# Patient Record
Sex: Male | Born: 1985 | Race: White | Hispanic: No | Marital: Single | Smoking: Never smoker
Health system: Southern US, Community
[De-identification: ages and names within clinical notes are randomized; demographics above are authoritative.]

## PROBLEM LIST (undated history)

## (undated) DIAGNOSIS — R569 Unspecified convulsions: Secondary | ICD-10-CM

---
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
## 2007-09-28 HISTORY — PX: NEUROMA SURGERY: SHX722

## 2012-12-08 ENCOUNTER — Encounter (HOSPITAL_COMMUNITY): Payer: Self-pay | Admitting: Cardiology

## 2012-12-08 ENCOUNTER — Emergency Department (HOSPITAL_COMMUNITY)
Admission: EM | Admit: 2012-12-08 | Discharge: 2012-12-08 | Disposition: A | Discharge status: Home / Self Care | Payer: Medicare Other | Attending: Emergency Medicine | Admitting: Emergency Medicine

## 2012-12-08 DIAGNOSIS — G40909 Epilepsy, unspecified, not intractable, without status epilepticus: Secondary | ICD-10-CM | POA: Insufficient documentation

## 2012-12-08 DIAGNOSIS — H9190 Unspecified hearing loss, unspecified ear: Secondary | ICD-10-CM | POA: Insufficient documentation

## 2012-12-08 DIAGNOSIS — H9192 Unspecified hearing loss, left ear: Secondary | ICD-10-CM

## 2012-12-08 DIAGNOSIS — Z79899 Other long term (current) drug therapy: Secondary | ICD-10-CM | POA: Insufficient documentation

## 2012-12-08 HISTORY — DX: Unspecified convulsions: R56.9

## 2012-12-08 NOTE — ED Notes (Signed)
Pt reports pain in his left ear. States he took a shower and got water in it and now has not been able to hear anything. Denies any pain. Reports he saw some blood on a q-tip today.

## 2012-12-08 NOTE — ED Provider Notes (Signed)
History    This chart was scribed for Zachary Morn NP, a non-physician practitioner working with Doug Sou, MD  found by Lewanda Rife, ED Scribe. This patient was seen in room TR11C and the patient's care was started at 1700.     CSN: [id number]  Arrival date & time 12/08/12  1529   None     Chief Complaint  Patient presents with  . Otalgia    (Consider location/radiation/quality/duration/timing/severity/associated sxs/prior treatment) The history is provided by the patient.   [name] is a 27 y.o. male who presents to the Emergency Department complaining of mild constant "muffled" hearing in left ear onset today. Zachary Patrick denies recent injury, recent illness, fever, tinnitus, recurrent ear infections, and pain. Zachary Patrick denies trying to irrigate ear to treat symptoms.    Past Medical History  Diagnosis Date  . Seizures     History reviewed. No pertinent past surgical history.  History reviewed. No pertinent family history.  History  Substance Use Topics  . Smoking status: Never Smoker   . Smokeless tobacco: Not on file  . Alcohol Use: Yes      Review of Systems  Constitutional: Negative.  Negative for fever.  HENT: Positive for hearing loss. Negative for ear pain, rhinorrhea, tinnitus and ear discharge.   Respiratory: Negative.   Cardiovascular: Negative.   Gastrointestinal: Negative.   Musculoskeletal: Negative.   Skin: Negative.   Neurological: Negative.   Psychiatric/Behavioral: Negative.   All other systems reviewed and are negative.   A complete 10 system review of systems was obtained and all systems are negative except as noted in the HPI and PMH.    Allergies  Review of patient's allergies indicates no known allergies.  Home Medications   Current Outpatient Rx  Name  Route  Sig  Dispense  Refill  . lacosamide (VIMPAT) 200 MG TABS   Oral   Take 200 mg by mouth 2 (two) times daily.           BP 162/94  Pulse 70  Temp(Src) 98.3 F  (36.8 C) (Oral)  SpO2 97%  Physical Exam  Nursing note and vitals reviewed. Constitutional: He is oriented to person, place, and time. He appears well-developed and well-nourished. No distress.  HENT:  Head: Normocephalic and atraumatic.  Right Ear: Hearing, tympanic membrane, external ear and ear canal normal.  Left Ear: Tympanic membrane, external ear and ear canal normal. No drainage, swelling or tenderness. Tympanic membrane is not erythematous.  No middle ear effusion.  Zachary Patrick states hearing is "muffled" when discerning rubbing his own hair by left ear.  Eyes: EOM are normal.  Neck: Normal range of motion. Neck supple. No tracheal deviation present.  Cardiovascular: Normal rate.   Pulmonary/Chest: Effort normal. No respiratory distress.  Abdominal: Soft.  Musculoskeletal: Normal range of motion.  Neurological: He is alert and oriented to person, place, and time.  Skin: Skin is warm and dry.  Psychiatric: He has a normal mood and affect. His behavior is normal.    ED Course  Procedures (including critical care time) Medications - No data to display  Labs Reviewed - No data to display No results found.   1. Hearing decreased, left       MDM  I personally performed the services described in this documentation, which was scribed in my presence. The recorded information has been reviewed and is accurate.         Jimmye Norman, NP 12/08/12 2017

## 2012-12-09 NOTE — ED Provider Notes (Signed)
Medical screening examination/treatment/procedure(s) were performed by non-physician practitioner and as supervising physician I was immediately available for consultation/collaboration.  Sam Jacubowitz, MD 12/09/12 0009 

## 2014-03-25 ENCOUNTER — Emergency Department (HOSPITAL_COMMUNITY)
Admission: EM | Admit: 2014-03-25 | Discharge: 2014-03-25 | Disposition: A | Discharge status: Home / Self Care | Payer: Medicare Other | Attending: Emergency Medicine | Admitting: Emergency Medicine

## 2014-03-25 ENCOUNTER — Encounter (HOSPITAL_COMMUNITY): Payer: Self-pay | Admitting: Emergency Medicine

## 2014-03-25 ENCOUNTER — Emergency Department (HOSPITAL_COMMUNITY): Payer: Medicare Other

## 2014-03-25 DIAGNOSIS — Y929 Unspecified place or not applicable: Secondary | ICD-10-CM | POA: Insufficient documentation

## 2014-03-25 DIAGNOSIS — Y939 Activity, unspecified: Secondary | ICD-10-CM | POA: Insufficient documentation

## 2014-03-25 DIAGNOSIS — R296 Repeated falls: Secondary | ICD-10-CM | POA: Insufficient documentation

## 2014-03-25 DIAGNOSIS — S63509A Unspecified sprain of unspecified wrist, initial encounter: Secondary | ICD-10-CM | POA: Insufficient documentation

## 2014-03-25 DIAGNOSIS — Z79899 Other long term (current) drug therapy: Secondary | ICD-10-CM | POA: Diagnosis not present

## 2014-03-25 DIAGNOSIS — G40909 Epilepsy, unspecified, not intractable, without status epilepticus: Secondary | ICD-10-CM | POA: Insufficient documentation

## 2014-03-25 DIAGNOSIS — S59909A Unspecified injury of unspecified elbow, initial encounter: Secondary | ICD-10-CM | POA: Diagnosis present

## 2014-03-25 DIAGNOSIS — S63502A Unspecified sprain of left wrist, initial encounter: Secondary | ICD-10-CM

## 2014-03-25 NOTE — ED Notes (Signed)
Ortho at bedside.

## 2014-03-25 NOTE — ED Notes (Signed)
PT ambulated with baseline gait; VSS; A&Ox3; no signs of distress; respirations even and unlabored; skin warm and dry; no questions upon discharge.  

## 2014-03-25 NOTE — ED Notes (Signed)
Pt cannot tell me what epilepsy medicine is; needs to take by 2000.

## 2014-03-25 NOTE — Progress Notes (Signed)
Orthopedic Tech Progress Note Patient Details:  Zachary Patrick 23-Jan-1986 [id number]  Ortho Devices Type of Ortho Device: Velcro wrist splint Ortho Device/Splint Location: lue Ortho Device/Splint Interventions: Application   Crawford, Rembert 03/25/2014, 8:32 PM

## 2014-03-25 NOTE — Discharge Instructions (Signed)
1. Medications: usual home medications 2. Treatment: rest, drink plenty of fluids, ice, elevate, wear your brace 3. Follow Up: Please followup with your hand orthopedics and your pcp for discussion of your diagnoses and further evaluation after today's visit; if you do not have a primary care doctor use the resource guide provided to find one;     Wrist Sprain with Rehab A sprain is an injury in which a ligament that maintains the proper alignment of a joint is partially or completely torn. The ligaments of the wrist are susceptible to sprains. Sprains are classified into three categories. Grade 1 sprains cause pain, but the tendon is not lengthened. Grade 2 sprains include a lengthened ligament because the ligament is stretched or partially ruptured. With grade 2 sprains there is still function, although the function may be diminished. Grade 3 sprains are characterized by a complete tear of the tendon or muscle, and function is usually impaired. SYMPTOMS   Pain tenderness, inflammation, and/or bruising (contusion) of the injury.  A "pop" or tear felt and/or heard at the time of injury.  Decreased wrist function. CAUSES  A wrist sprain occurs when a force is placed on one or more ligaments that is greater than it/they can withstand. Common mechanisms of injury include:  Catching a ball with you hands.  Repetitive and/ or strenuous extension or flexion of the wrist. RISK INCREASES WITH:  Previous wrist injury.  Contact sports (boxing or wrestling).  Activities in which falling is common.  Poor strength and flexibility.  Improperly fitted or padded protective equipment. PREVENTION  Warm up and stretch properly before activity.  Allow for adequate recovery between workouts.  Maintain physical fitness:  Strength, flexibility, and endurance.  Cardiovascular fitness.  Protect the wrist joint by limiting its motion with the use of taping, braces, or splints.  Protect the wrist  after injury for 6 to 12 months. PROGNOSIS  The prognosis for wrist sprains depends on the degree of injury. Grade 1 sprains require 2 to 6 weeks of treatment. Grade 2 sprains require 6 to 8 weeks of treatment, and grade 3 sprains require up to 12 weeks.  RELATED COMPLICATIONS   Prolonged healing time, if improperly treated or re-injured.  Recurrent symptoms that result in a chronic problem.  Injury to nearby structures (bone, cartilage, nerves, or tendons).  Arthritis of the wrist.  Inability to compete in athletics at a high level.  Wrist stiffness or weakness.  Progression to a complete rupture of the ligament. TREATMENT  Treatment initially involves resting from any activities that aggravate the symptoms, and the use of ice and medications to help reduce pain and inflammation. Your caregiver may recommend immobilizing the wrist for a period of time in order to reduce stress on the ligament and allow for healing. After immobilization it is important to perform strengthening and stretching exercises to help regain strength and a full range of motion. These exercises may be completed at home or with a therapist. Surgery is not usually required for wrist sprains, unless the ligament has been ruptured (grade 3 sprain). MEDICATION   If pain medication is necessary, then nonsteroidal anti-inflammatory medications, such as aspirin and ibuprofen, or other minor pain relievers, such as acetaminophen, are often recommended.  Do not take pain medication for 7 days before surgery.  Prescription pain relievers may be given if deemed necessary by your caregiver. Use only as directed and only as much as you need. HEAT AND COLD  Cold treatment (icing) relieves pain and  reduces inflammation. Cold treatment should be applied for 10 to 15 minutes every 2 to 3 hours for inflammation and pain and immediately after any activity that aggravates your symptoms. Use ice packs or massage the area with a piece  of ice (ice massage).  Heat treatment may be used prior to performing the stretching and strengthening activities prescribed by your caregiver, physical therapist, or athletic trainer. Use a heat pack or soak your injury in warm water. SEEK MEDICAL CARE IF:  Treatment seems to offer no benefit, or the condition worsens.  Any medications produce adverse side effects. EXERCISES RANGE OF MOTION (ROM) AND STRETCHING EXERCISES - Wrist Sprain  These exercises may help you when beginning to rehabilitate your injury. Your symptoms may resolve with or without further involvement from your physician, physical therapist or athletic trainer. While completing these exercises, remember:   Restoring tissue flexibility helps normal motion to return to the joints. This allows healthier, less painful movement and activity.  An effective stretch should be held for at least 30 seconds.  A stretch should never be painful. You should only feel a gentle lengthening or release in the stretched tissue. RANGE OF MOTION - Wrist Flexion, Active-Assisted  Extend your right / left elbow with your fingers pointing down.*  Gently pull the back of your hand towards you until you feel a gentle stretch on the top of your forearm.  Hold this position for __________ seconds. Repeat __________ times. Complete this exercise __________ times per day.  *If directed by your physician, physical therapist or athletic trainer, complete this stretch with your elbow bent rather than extended. RANGE OF MOTION - Wrist Extension, Active-Assisted  Extend your right / left elbow and turn your palm upwards.*  Gently pull your palm/fingertips back so your wrist extends and your fingers point more toward the ground.  You should feel a gentle stretch on the inside of your forearm.  Hold this position for __________ seconds. Repeat __________ times. Complete this exercise __________ times per day. *If directed by your physician,  physical therapist or athletic trainer, complete this stretch with your elbow bent, rather than extended. RANGE OF MOTION - Supination, Active  Stand or sit with your elbows at your side. Bend your right / left elbow to 90 degrees.  Turn your palm upward until you feel a gentle stretch on the inside of your forearm.  Hold this position for __________ seconds. Slowly release and return to the starting position. Repeat __________ times. Complete this stretch __________ times per day.  RANGE OF MOTION - Pronation, Active  Stand or sit with your elbows at your side. Bend your right / left elbow to 90 degrees.  Turn your palm downward until you feel a gentle stretch on the top of your forearm.  Hold this position for __________ seconds. Slowly release and return to the starting position. Repeat __________ times. Complete this stretch __________ times per day.  STRETCH - Wrist Flexion  Place the back of your right / left hand on a tabletop leaving your elbow slightly bent. Your fingers should point away from your body.  Gently press the back of your hand down onto the table by straightening your elbow. You should feel a stretch on the top of your forearm.  Hold this position for __________ seconds. Repeat __________ times. Complete this stretch __________ times per day.  STRETCH - Wrist Extension  Place your right / left fingertips on a tabletop leaving your elbow slightly bent. Your fingers should point  backwards.  Gently press your fingers and palm down onto the table by straightening your elbow. You should feel a stretch on the inside of your forearm.  Hold this position for __________ seconds. Repeat __________ times. Complete this stretch __________ times per day.  STRENGTHENING EXERCISES - Wrist Sprain These exercises may help you when beginning to rehabilitate your injury. They may resolve your symptoms with or without further involvement from your physician, physical therapist or  athletic trainer. While completing these exercises, remember:   Muscles can gain both the endurance and the strength needed for everyday activities through controlled exercises.  Complete these exercises as instructed by your physician, physical therapist or athletic trainer. Progress with the resistance and repetition exercises only as your caregiver advises. STRENGTH - Wrist Flexors  Sit with your right / left forearm palm-up and fully supported. Your elbow should be resting below the height of your shoulder. Allow your wrist to extend over the edge of the surface.  Loosely holding a __________ weight or a piece of rubber exercise band/tubing, slowly curl your hand up toward your forearm.  Hold this position for __________ seconds. Slowly lower the wrist back to the starting position in a controlled manner. Repeat __________ times. Complete this exercise __________ times per day.  STRENGTH - Wrist Extensors  Sit with your right / left forearm palm-down and fully supported. Your elbow should be resting below the height of your shoulder. Allow your wrist to extend over the edge of the surface.  Loosely holding a __________ weight or a piece of rubber exercise band/tubing, slowly curl your hand up toward your forearm.  Hold this position for __________ seconds. Slowly lower the wrist back to the starting position in a controlled manner. Repeat __________ times. Complete this exercise __________ times per day.  STRENGTH - Ulnar Deviators  Stand with a ____________________ weight in your right / left hand, or sit holding on to the rubber exercise band/tubing with your opposite arm supported.  Move your wrist so that your pinkie travels toward your forearm and your thumb moves away from your forearm.  Hold this position for __________ seconds and then slowly lower the wrist back to the starting position. Repeat __________ times. Complete this exercise __________ times per day STRENGTH -  Radial Deviators  Stand with a ____________________ weight in your  right / left hand, or sit holding on to the rubber exercise band/tubing with your arm supported.  Raise your hand upward in front of you or pull up on the rubber tubing.  Hold this position for __________ seconds and then slowly lower the wrist back to the starting position. Repeat __________ times. Complete this exercise __________ times per day. STRENGTH - Forearm Supinators  Sit with your right / left forearm supported on a table, keeping your elbow below shoulder height. Rest your hand over the edge, palm down.  Gently grip a hammer or a soup ladle.  Without moving your elbow, slowly turn your palm and hand upward to a "thumbs-up" position.  Hold this position for __________ seconds. Slowly return to the starting position. Repeat __________ times. Complete this exercise __________ times per day.  STRENGTH - Forearm Pronators  Sit with your right / left forearm supported on a table, keeping your elbow below shoulder height. Rest your hand over the edge, palm up.  Gently grip a hammer or a soup ladle.  Without moving your elbow, slowly turn your palm and hand upward to a "thumbs-up" position.  Hold this position  for __________ seconds. Slowly return to the starting position. Repeat __________ times. Complete this exercise __________ times per day.  STRENGTH - Grip  Grasp a tennis ball, a dense sponge, or a large, rolled sock in your hand.  Squeeze as hard as you can without increasing any pain.  Hold this position for __________ seconds. Release your grip slowly. Repeat __________ times. Complete this exercise __________ times per day.  Document Released: 09/13/2005 Document Revised: 12/06/2011 Document Reviewed: 12/26/2008 [facility] Patient Information 2015 [place], [place]. This information is not intended to replace advice given to you by your health care provider. Make sure you discuss any questions you  have with your health care provider.

## 2014-03-25 NOTE — ED Notes (Signed)
Jarrett Soho, PA-C, is at the bedside.

## 2014-03-25 NOTE — ED Notes (Signed)
PA at bedside.

## 2014-03-25 NOTE — ED Notes (Signed)
Reports having epilepsy and had a fall this weekend, having pain to left wrist since. +radial pulse, able to move digits, no obv injury noted.

## 2014-03-25 NOTE — ED Provider Notes (Signed)
Medical screening examination/treatment/procedure(s) were performed by non-physician practitioner and as supervising physician I was immediately available for consultation/collaboration.   EKG Interpretation None        [place], DO 03/25/14 2319

## 2014-03-25 NOTE — ED Provider Notes (Signed)
CSN: [id number]     Arrival date & time 03/25/14  1726 History  This chart was scribed for non-physician practitioner Zachary Sato, PA-C working with Zachary Hamburg, Zachary Patrick by Zachary Patrick, ED Scribe. This patient was seen in room TR10C/TR10C and the patient's care was started at 8:16 PM .   Chief Complaint  Patient presents with  . Wrist Injury   The history is provided by the patient and medical records. No language interpreter was used.   HPI Comments: Zachary Patrick is a 28 y.o. male with hx of seizures who presents to the Emergency Department complaining of L wrist injury that occurred this weekend. Pt reports having an epilepsy seizure, falling to the floor and "woke up on the floor"; reports having L wrist pain gradually worsening after that.  States he usually has a seizure approximately every 3 months even when he is compliant with his dictations. He reports he has been taking his medications as directed.  Pt reports he had 3 seizures this weekend. Pt will be seen by PCP in 2 weeks to evaluate his levels. States he is unable to flex wrist due to pain in the feeling of weakness. Denies numbness, tingling or decreased grip strength of the left hand.   Past Medical History  Diagnosis Date  . Seizures    History reviewed. No pertinent past surgical history. History reviewed. No pertinent family history. History  Substance Use Topics  . Smoking status: Never Smoker   . Smokeless tobacco: Not on file  . Alcohol Use: Yes    Review of Systems  Constitutional: Negative for fever and chills.  Gastrointestinal: Negative for nausea and vomiting.  Musculoskeletal: Positive for arthralgias and joint swelling. Negative for back pain, neck pain and neck stiffness.  Skin: Positive for color change. Negative for wound.  Neurological: Negative for numbness.  Hematological: Does not bruise/bleed easily.  Psychiatric/Behavioral: The patient is not nervous/anxious.   All other  systems reviewed and are negative.   Allergies  Review of patient's allergies indicates no known allergies.  Home Medications   Prior to Admission medications   Medication Sig Start Date End Date Taking? Authorizing Provider  lacosamide (VIMPAT) 200 MG TABS Take 200 mg by mouth 2 (two) times daily.   Yes Historical Provider, MD  levETIRAcetam (KEPPRA) 1000 MG tablet Take 1,000 mg by mouth 2 (two) times daily.   Yes Historical Provider, MD  oxcarbazepine (TRILEPTAL) 600 MG tablet Take 600 mg by mouth 2 (two) times daily.   Yes Historical Provider, MD   BP 143/98  Pulse 81  Temp(Src) 98.1 F (36.7 C) (Oral)  Resp 18  SpO2 96%  Physical Exam  Nursing note and vitals reviewed. Constitutional: He is oriented to person, place, and time. He appears well-developed and well-nourished. No distress.  HENT:  Head: Normocephalic and atraumatic.  Eyes: Conjunctivae are normal.  Neck: Normal range of motion.  Cardiovascular: Normal rate, regular rhythm, normal heart sounds and intact distal pulses.   No murmur heard. Capillary refill less than 3 seconds  Pulmonary/Chest: Effort normal and breath sounds normal. No respiratory distress. He has no wheezes.  Musculoskeletal: He exhibits tenderness. He exhibits no edema.  Full ROM: of all fingers to the L hand. Full ROM of the L elbow and shoulder. Decreased flexion and extension of the L wrist. Large amount of ecchymosis to palm of L hand extending half way down forearm. No pain to palpation of the anatomical snuff box  Neurological: He is alert  and oriented to person, place, and time. Coordination normal.  Sensation intact to dull and sharp throughout the hand and arm. Strength 5/5 in the L hand including grip strength and resisted flexion and extension of the elbow. 3/5 in the wrist due to pain.  Skin: Skin is warm and dry. He is not diaphoretic. No erythema.  No tenting of the skin  Psychiatric: He has a normal mood and affect.    ED  Course  Procedures  DIAGNOSTIC STUDIES: Oxygen Saturation is 96% on RA, normal by my interpretation.    COORDINATION OF CARE: 8:19 PM-Discussed treatment plan which includes discussed X-ray findings and will discharge pt with a Velcro splint. Will provide pt with a hand referral. Pt agreed to plan.   Labs Review Labs Reviewed - No data to display  Imaging Review Dg Wrist Complete Left  03/25/2014   CLINICAL DATA:  Epilepsy.  Fall  EXAM: LEFT WRIST - COMPLETE 3+ VIEW  COMPARISON:  None.  FINDINGS: There is no evidence of fracture or dislocation. There is no evidence of arthropathy or other focal bone abnormality. Soft tissues are unremarkable.  IMPRESSION: Negative.   Electronically Signed   By: Zachary Patrick M.D.   On: 03/25/2014 18:38     EKG Interpretation None     MDM   Final diagnoses:  Wrist sprain, left, initial encounter   Zachary Patrick presents with pain, swelling and ecchymosis to the left breast after seizure this weekend.  Patient X-Ray negative for obvious fracture or dislocation. Pain managed in ED. Pt advised to follow up with hand orthopedics for further evaluation within one week. Patient reports he has his own hand orthopedist and he is welcome to followup with this physician as well.  Patient to return to the emergency department for increased swelling, numbness or weakness in the hand or arm. Patient given brace while in ED, conservative therapy recommended and discussed. Patient will be dc home & is agreeable with above plan.  Vital signs are stable at discharge.   BP 143/98  Pulse 81  Temp(Src) 98.1 F (36.7 C) (Oral)  Resp 18  SpO2 96%  I personally performed the services described in this documentation, which was scribed in my presence. The recorded information has been reviewed and is accurate.    Zachary Soho [place], PA-C 03/25/14 2043

## 2014-03-25 NOTE — ED Notes (Signed)
Ortho coming to do splint.

## 2017-03-21 ENCOUNTER — Emergency Department (HOSPITAL_COMMUNITY)
Admission: EM | Admit: 2017-03-21 | Discharge: 2017-03-21 | Disposition: A | Discharge status: Home / Self Care | Payer: Medicare Other | Attending: Emergency Medicine | Admitting: Emergency Medicine

## 2017-03-21 ENCOUNTER — Emergency Department (HOSPITAL_COMMUNITY): Payer: Medicare Other

## 2017-03-21 ENCOUNTER — Encounter (HOSPITAL_COMMUNITY): Payer: Self-pay | Admitting: *Deleted

## 2017-03-21 DIAGNOSIS — Y999 Unspecified external cause status: Secondary | ICD-10-CM | POA: Insufficient documentation

## 2017-03-21 DIAGNOSIS — Y929 Unspecified place or not applicable: Secondary | ICD-10-CM | POA: Insufficient documentation

## 2017-03-21 DIAGNOSIS — Z23 Encounter for immunization: Secondary | ICD-10-CM | POA: Insufficient documentation

## 2017-03-21 DIAGNOSIS — R569 Unspecified convulsions: Secondary | ICD-10-CM | POA: Insufficient documentation

## 2017-03-21 DIAGNOSIS — X58XXXA Exposure to other specified factors, initial encounter: Secondary | ICD-10-CM | POA: Diagnosis not present

## 2017-03-21 DIAGNOSIS — Y939 Activity, unspecified: Secondary | ICD-10-CM | POA: Insufficient documentation

## 2017-03-21 DIAGNOSIS — S81812A Laceration without foreign body, left lower leg, initial encounter: Secondary | ICD-10-CM

## 2017-03-21 DIAGNOSIS — Z79899 Other long term (current) drug therapy: Secondary | ICD-10-CM | POA: Insufficient documentation

## 2017-03-21 MED ORDER — LIDOCAINE-EPINEPHRINE (PF) 2 %-1:200000 IJ SOLN
10.0000 mL | Freq: Once | INTRAMUSCULAR | Status: AC
  Administered 2017-03-21: 10 mL
  Filled 2017-03-21: qty 20

## 2017-03-21 MED ORDER — TETANUS-DIPHTH-ACELL PERTUSSIS 5-2.5-18.5 LF-MCG/0.5 IM SUSP
0.5000 mL | Freq: Once | INTRAMUSCULAR | Status: AC
  Administered 2017-03-21: 0.5 mL via INTRAMUSCULAR
  Filled 2017-03-21: qty 0.5

## 2017-03-21 NOTE — ED Triage Notes (Signed)
Patient is alert and oriented x4.  He is being seen post seizure.  Patient states that he had a seizure approximately 8;45 this morning.  During the seizure the patient cut his left leg below the knee.  Bleeding is controlled but he wanted to get it looked at for possible stitches.  Currently he rates his pain 2 of 10.

## 2017-03-21 NOTE — Discharge Instructions (Signed)
Keep wound clean with mild soap and water. Keep area covered with a topical antibiotic ointment and bandage, keep bandage dry, and do not submerge in water for 24 hours. Ice and elevate for additional pain and swelling relief. Alternate between Ibuprofen and Tylenol for additional pain relief. Follow up with your primary care doctor or the [facility] [place] in approximately 7-10 days for wound recheck and suture removal. Monitor area for signs of infection to include, but not limited to: increasing pain, spreading redness, drainage/pus, worsening swelling, or fevers. Return to emergency department for emergent changing or worsening symptoms.

## 2017-03-21 NOTE — ED Provider Notes (Signed)
[facility] DEPT Provider Note   CSN: [id number] Arrival date & time: 03/21/17  2409  By signing my name below, I, Jeanell Sparrow, attest that this documentation has been prepared under the direction and in the presence of non-physician practitioner, [address], PA-C. Electronically Signed: Jeanell Sparrow, Scribe. 03/21/2017. 12:12 PM.  History   Chief Complaint Chief Complaint  Patient presents with  . Seizures  . Extremity Laceration    left leg   The history is provided by the patient and medical records. No language interpreter was used.  Laceration   The incident occurred 3 to 5 hours ago. The laceration is located on the left leg. The laceration is 1 cm in size. The laceration mechanism is unknown.The pain is at a severity of 2/10. The pain is mild. The pain has been intermittent since onset. He reports no foreign bodies present. His tetanus status is unknown.    HPI Comments: Zachary Patrick is a 31 y.o. male with a PMHx of seizures who presents to the Emergency Department complaining of a LLE wound that occurred about 4 hours ago. He states that he had a seizure this morning, which is a common occurrence for him; he is currently under management for seizures at [facility]. He is at baseline and denies any ongoing issue related to the seizure; states he's only here for stitches for his wound. He cut his lower LLE on unknown surface during the seizure. His bleeding was controlled with pressure. He describes the pain as 2/10 intermittent, sore, non-radiating L shin/leg pain, exacerbated by ambulation, with no treatments tried PTA. He reports associated swelling around wound. Unsure of tetanus status. Denies recent fevers, chills, rhinorrhea, cough, URI symptoms, lightheadedness, HA, vision changes, CP, SOB, abd pain, N/V/D/C, hematuria, dysuria, arthralgias/left knee or ankle pain or swelling, bruising, numbness, tingling, focal weakness, confusion, or any other complaints at this  time. Not on blood thinners.    Past Medical History:  Diagnosis Date  . Seizures ([place])     There are no active problems to display for this patient.   No past surgical history on file.     Home Medications    Prior to Admission medications   Medication Sig Start Date End Date Taking? Authorizing Provider  lacosamide (VIMPAT) 200 MG TABS Take 200 mg by mouth 2 (two) times daily.    [provider]  levETIRAcetam (KEPPRA) 1000 MG tablet Take 1,000 mg by mouth 2 (two) times daily.    [provider]  oxcarbazepine (TRILEPTAL) 600 MG tablet Take 600 mg by mouth 2 (two) times daily.    [provider]    Family History No family history on file.  Social History Social History  Substance Use Topics  . Smoking status: Never Smoker  . Smokeless tobacco: Not on file  . Alcohol use Yes     Allergies   Patient has no known allergies.   Review of Systems Review of Systems  Constitutional: Negative for chills and fever.  Eyes: Negative for visual disturbance.  Respiratory: Negative for shortness of breath.   Cardiovascular: Negative for chest pain.  Gastrointestinal: Negative for abdominal pain, constipation, diarrhea, nausea and vomiting.  Genitourinary: Negative for dysuria and hematuria.  Musculoskeletal: Positive for joint swelling (over LLE wound) and myalgias (pain around wound L leg). Negative for arthralgias.  Skin: Positive for wound. Negative for color change.  Allergic/Immunologic: Negative for immunocompromised state.  Neurological: Positive for seizures (resolved). Negative for weakness, light-headedness, numbness and headaches.  Hematological: Does not bruise/bleed easily.  Psychiatric/Behavioral: Negative for confusion.  All other systems reviewed and are negative for acute change except as noted in the HPI.     Physical Exam Updated Vital Signs BP (!) 160/79   Pulse 70   Temp 98.1 F (36.7 C) (Oral)   Resp 18   Ht 6\' 1"   (1.854 m)   Wt 250 lb (113.4 kg)   SpO2 97%   BMI 32.98 kg/m   Physical Exam  Constitutional: He is oriented to person, place, and time. Vital signs are normal. He appears well-developed and well-nourished.  Non-toxic appearance. No distress.  Afebrile, nontoxic, NAD  HENT:  Head: Normocephalic and atraumatic.  Mouth/Throat: Mucous membranes are normal.  Eyes: Conjunctivae and EOM are normal. Right eye exhibits no discharge. Left eye exhibits no discharge.  Neck: Normal range of motion. Neck supple.  Cardiovascular: Normal rate and intact distal pulses.   Pulmonary/Chest: Effort normal. No respiratory distress.  Abdominal: Normal appearance. He exhibits no distension.  Musculoskeletal: Normal range of motion.       Left knee: He exhibits laceration. He exhibits normal range of motion, no swelling, no effusion, no ecchymosis, no deformity, no erythema, normal alignment, no LCL laxity, normal patellar mobility and no MCL laxity. Tenderness (anterior shin) found.  Left knee with FROM intact, no joint line TTP, but with mild TTP to the anterior shin just the knee over the area of the laceration. Small ~1cm linear laceration, no ongoing bleeding, no retained FBs, SEE PICTURE BELOW.   No swelling/effusion/deformity, no bruising or erythema, no warmth, no abnormal alignment or patellar mobility, no varus/valgus laxity, neg anterior drawer test, no crepitus.  Strength and sensation grossly intact, distal pulses intact, compartments soft  Neurological: He is alert and oriented to person, place, and time. He has normal strength. No sensory deficit.  Skin: Skin is warm and dry. Laceration noted. No rash noted.  Left knee laceration as mentioned above and pictured below.   Psychiatric: He has a normal mood and affect.  Nursing note and vitals reviewed.      ED Treatments / Results  DIAGNOSTIC STUDIES: Oxygen Saturation is 97% on RA, normal by my interpretation.    COORDINATION OF CARE: 12:16  PM- Pt advised of plan for treatment and pt agrees.  Labs (all labs ordered are listed, but only abnormal results are displayed) Labs Reviewed - No data to display  EKG  EKG Interpretation None       Radiology Dg Tibia/fibula Left  Result Date: 03/21/2017 CLINICAL DATA:  Left leg laceration during a seizure this morning. EXAM: LEFT TIBIA AND FIBULA - 2 VIEW COMPARISON:  None. FINDINGS: There is no evidence of fracture or other focal bone lesions. Soft tissues are unremarkable. IMPRESSION: Normal examination.  No fracture or radiopaque foreign body. Electronically Signed   By: Claudie Revering M.D.   On: 03/21/2017 13:38    Procedures .Marland KitchenLaceration Repair Date/Time: 03/21/2017 12:45 PM Performed by: Joellyn Haff Authorized by: Reece Agar   Consent:    Consent obtained:  Verbal   Consent given by:  Patient   Risks discussed:  Infection, pain, poor cosmetic result and retained foreign body   Alternatives discussed:  No treatment and observation Anesthesia (see MAR for exact dosages):    Anesthesia method:  Local infiltration   Local anesthetic:  Lidocaine 2% WITH epi Laceration details:    Location:  Leg   Leg location:  L lower leg   Length (cm):  1   Depth (mm):  5 Repair type:    Repair type:  Simple Pre-procedure details:    Preparation:  Patient was prepped and draped in usual sterile fashion and imaging obtained to evaluate for foreign bodies Exploration:    Hemostasis achieved with:  Direct pressure   Wound exploration: wound explored through full range of motion and entire depth of wound probed and visualized     Wound extent: no foreign bodies/material noted     Contaminated: no   Treatment:    Area cleansed with:  Saline   Amount of cleaning:  Standard   Irrigation solution:  Sterile saline   Irrigation method:  Syringe Skin repair:    Repair method:  Sutures   Suture size:  4-0   Suture material:  Prolene   Suture technique:  Simple interrupted    Number of sutures:  4 Approximation:    Approximation:  Close   Vermilion border: well-aligned   Post-procedure details:    Dressing:  Non-adherent dressing   Patient tolerance of procedure:  Tolerated well, no immediate complications   (including critical care time)  Medications Ordered in ED Medications  lidocaine-EPINEPHrine (XYLOCAINE W/EPI) 2 %-1:200000 (PF) injection 10 mL (10 mLs Infiltration Given by Other 03/21/17 1246)  Tdap (BOOSTRIX) injection 0.5 mL (0.5 mLs Intramuscular Given 03/21/17 1335)     Initial Impression / Assessment and Plan / ED Course  I have reviewed the triage vital signs and the nursing notes.  Pertinent labs & imaging results that were available during my care of the patient were reviewed by me and considered in my medical decision making (see chart for details).     31 y.o. male here with L lower leg lac on anterior shin after having seizure this morning. Back to baseline, has no complaints regarding seizure, states this is a common issue and he is under management by [facility] for this; not currently wanting/needing evaluation for the seizure, just came for stitches. On exam, small ~1-2cm linear lac to L anterior shin just under the knee. Mild tenderness around the lac, but no focal joint line tenderness to the knee. No bruising/swelling, NVI with soft compartments. No ongoing bleeding, no retained FBs. Will obtain xray to ensure no underlying osseous injury since he isn't sure what he hit it on. Wound to be repaired by the student Carolee Rota PA-C under my guidance. Will update Tdap. Pt declines wanting anything for pain. Will reassess shortly.   1:48 PM Xray negative for fx or retained FB. Four 4-0 prolene sutures placed with good hemostasis and cosmesis. Advised proper wound care. Doubt need for ppx abx. Advised tylenol/motrin and RICE. F/up with PCP in 7-10 days for wound check and suture removal, as well as for ongoing seizure management. I  explained the diagnosis and have given explicit precautions to return to the ER including for any other new or worsening symptoms. The patient understands and accepts the medical plan as it's been dictated and I have answered their questions. Discharge instructions concerning home care and prescriptions have been given. The patient is STABLE and is discharged to home in good condition.     I personally performed the services described in this documentation, which was scribed in my presence. The recorded information has been reviewed and is accurate.   Final Clinical Impressions(s) / ED Diagnoses   Final diagnoses:  Laceration of left lower extremity, initial encounter    New Prescriptions New Prescriptions   No medications on file  [address], [place], [place] 03/21/17 1349    Isla Pence, MD 03/21/17 [phone]

## 2017-03-31 ENCOUNTER — Encounter (HOSPITAL_COMMUNITY): Payer: Self-pay | Admitting: Family Medicine

## 2017-03-31 ENCOUNTER — Ambulatory Visit (HOSPITAL_COMMUNITY)
Admission: EM | Admit: 2017-03-31 | Discharge: 2017-03-31 | Disposition: A | Discharge status: Home / Self Care | Payer: Medicare Other

## 2017-03-31 MED ORDER — BACITRACIN ZINC 500 UNIT/GM EX OINT
TOPICAL_OINTMENT | CUTANEOUS | Status: AC
Start: 2017-03-31 — End: 2017-03-31
  Filled 2017-03-31: qty 0.9

## 2017-03-31 NOTE — ED Triage Notes (Signed)
Pt here for suture removal. wound looks clean and well healed.

## 2019-01-02 ENCOUNTER — Other Ambulatory Visit: Payer: Self-pay

## 2019-01-02 ENCOUNTER — Emergency Department (HOSPITAL_COMMUNITY)
Admission: EM | Admit: 2019-01-02 | Discharge: 2019-01-02 | Disposition: A | Discharge status: Home / Self Care | Payer: Medicare Other | Attending: Emergency Medicine | Admitting: Emergency Medicine

## 2019-01-02 ENCOUNTER — Emergency Department (HOSPITAL_COMMUNITY): Payer: Medicare Other

## 2019-01-02 DIAGNOSIS — F419 Anxiety disorder, unspecified: Secondary | ICD-10-CM | POA: Diagnosis not present

## 2019-01-02 DIAGNOSIS — Z79899 Other long term (current) drug therapy: Secondary | ICD-10-CM | POA: Diagnosis not present

## 2019-01-02 DIAGNOSIS — R0789 Other chest pain: Secondary | ICD-10-CM | POA: Diagnosis not present

## 2019-01-02 LAB — BASIC METABOLIC PANEL
Anion gap: 11 (ref 5–15)
BUN: 8 mg/dL (ref 6–20)
CO2: 24 mmol/L (ref 22–32)
Calcium: 9.1 mg/dL (ref 8.9–10.3)
Chloride: 100 mmol/L (ref 98–111)
Creatinine, Ser: 1.14 mg/dL (ref 0.61–1.24)
GFR calc Af Amer: >60 mL/min (ref >60)
GFR calc non Af Amer: >60 mL/min (ref >60)
Glucose, Bld: 101 mg/dL — ABNORMAL HIGH (ref 70–99)
Potassium: 3.7 mmol/L (ref 3.5–5.1)
Sodium: 135 mmol/L (ref 135–145)

## 2019-01-02 LAB — CBC
HCT: 44.7 % (ref 39.0–52.0)
Hemoglobin: 15.7 g/dL (ref 13.0–17.0)
MCH: 31.2 pg (ref 26.0–34.0)
MCHC: 35.1 g/dL (ref 30.0–36.0)
MCV: 88.9 fL (ref 80.0–100.0)
Platelets: 228 10*3/uL (ref 150–400)
RBC: 5.03 MIL/uL (ref 4.22–5.81)
RDW: 11.2 % — ABNORMAL LOW (ref 11.5–15.5)
WBC: 8.1 10*3/uL (ref 4.0–10.5)
nRBC: 0 % (ref 0.0–0.2)

## 2019-01-02 LAB — TROPONIN I: Troponin I: <0.03 ng/mL (ref <0.03)

## 2019-01-02 MED ORDER — SODIUM CHLORIDE 0.9% FLUSH: 3.0000 mL | Freq: Once | INTRAVENOUS | Status: DC

## 2019-01-02 NOTE — Discharge Instructions (Addendum)
Your blood pressure was slightly elevated in the emergency room today.  This is common when in the emergency room due to the increased stress and anxiety.  I would recommend that you get your blood pressure rechecked in 1 week by your primary care doctor.  Please follow-up with your primary care doctor and your your neurologist as needed.

## 2019-01-02 NOTE — ED Provider Notes (Signed)
[facility] EMERGENCY DEPARTMENT Provider Note   CSN: [id number] Arrival date & time: 01/02/19  1332    History   Chief Complaint Chief Complaint  Patient presents with  . Chest Pain  . Arm Pain    HPI Zachary Patrick is a 33 y.o. male with a past medical history of seizures, with a neuro pace implanted device, who presents today for evaluation of chest pain.  He reports that last Friday he was rubbing his chest and states that he is concerned he may have bruised it.  He says that he had pain in the left side of his chest since then.  He said that it lasted about a day, and feels like a "pulled muscle."  He denies any shortness of breath, cough, fever.  He has no known sick contacts.  His chest pain has also been present in the right side of his chest.   He reports that he went online to look up his symptoms and was concerned that he was either having a heart attack or had lung cancer.  He does not smoke.  He reports that while he was online he started feeling very anxious and had what he describes as a panic attack with feelings of high anxiety and rapid breathing with tingling worsening and worsening chest pains.  He has had his neuro pace device since July 2018.  He reports compliance with his medications.  He has not had any seizures recently that were severe enough to have caused an injury or concerning seizures.  He denies any neck pain or headache.  He reports that he had intermittent tingling in his left arm over the entire upper arm and the ulnar aspect of the lower arm however this has fully resolved.  Right now at the time of evaluation he does not have any chest pain or change in sensation in his arm.  He denies any weakness.  He does not have a history of blood clots, no hematemesis, denies any leg swelling, recent surgeries or immobilizations.  His chest pain and tingling both fully resolve with a hot shower.      HPI  Past Medical History:  Diagnosis  Date  . Seizures ([place])     There are no active problems to display for this patient.   No past surgical history on file.      Home Medications    Prior to Admission medications   Medication Sig Start Date End Date Taking? Authorizing Provider  lacosamide (VIMPAT) 200 MG TABS Take 200 mg by mouth 2 (two) times daily.   Yes [provider]  levETIRAcetam (KEPPRA) 1000 MG tablet Take 2,000 mg by mouth 2 (two) times daily.    Yes [provider]  oxcarbazepine (TRILEPTAL) 600 MG tablet Take 1,200 mg by mouth 2 (two) times daily.    Yes [provider]    Family History No family history on file.  Social History Social History   Tobacco Use  . Smoking status: Never Smoker  . Smokeless tobacco: Never Used  Substance Use Topics  . Alcohol use: Yes  . Drug use: No     Allergies   Patient has no known allergies.   Review of Systems Review of Systems  Constitutional: Negative for chills, fatigue and fever.  HENT: Negative for congestion, sinus pressure, sinus pain and sore throat.   Respiratory: Positive for chest tightness. Negative for cough and shortness of breath.   Cardiovascular: Positive for chest pain.  Negative for palpitations and leg swelling.  Gastrointestinal: Negative for abdominal pain, diarrhea, nausea and vomiting.  Musculoskeletal: Negative for neck pain and neck stiffness.  Neurological: Positive for seizures (not new, no tonic-clonic.  ) and numbness (No true numbness, decreased sensation in left arm. ). Negative for dizziness and weakness.  Psychiatric/Behavioral: Negative for confusion. The patient is nervous/anxious.   All other systems reviewed and are negative.    Physical Exam Updated Vital Signs BP 134/79 (BP Location: Right Arm)   Pulse 64   Temp 98.2 F (36.8 C) (Oral)   Resp 16   Ht 6\' 1"  (1.854 m)   Wt 95.3 kg   SpO2 97%   BMI 27.71 kg/m   Physical Exam Vitals signs and nursing note reviewed.   Constitutional:      General: He is not in acute distress.    Appearance: He is well-developed. He is not diaphoretic.  HENT:     Head: Normocephalic and atraumatic.     Right Ear: External ear normal.     Left Ear: External ear normal.     Nose: Nose normal.  Eyes:     General: No scleral icterus.       Right eye: No discharge.        Left eye: No discharge.     Conjunctiva/sclera: Conjunctivae normal.     Pupils: Pupils are equal, round, and reactive to light.  Neck:     Musculoskeletal: Normal range of motion and neck supple.     Trachea: No tracheal deviation.  Cardiovascular:     Rate and Rhythm: Normal rate and regular rhythm.     Pulses:          Radial pulses are 2+ on the right side and 2+ on the left side.       Dorsalis pedis pulses are 2+ on the right side and 2+ on the left side.       Posterior tibial pulses are 2+ on the right side and 2+ on the left side.     Heart sounds: Normal heart sounds. No murmur. No friction rub. No gallop.   Pulmonary:     Effort: Pulmonary effort is normal. No respiratory distress.     Breath sounds: Normal breath sounds. No decreased breath sounds, wheezing or rhonchi.  Chest:     Chest wall: Tenderness (There is generalized tenderness to palpation over the left anterior sternocostal joints.  This pain also worsens with abduction and extension of the left arm.  Both palpation and arm movements re-created and exacerbate his reported pain.) present. No deformity or crepitus.  Abdominal:     General: Bowel sounds are normal. There is no distension.     Palpations: Abdomen is soft.     Tenderness: There is no abdominal tenderness. There is no guarding.  Musculoskeletal:        General: No deformity.     Right lower leg: He exhibits no tenderness. No edema.     Left lower leg: He exhibits no tenderness. No edema.  Lymphadenopathy:     Cervical: No cervical adenopathy.  Skin:    General: Skin is warm and dry.  Neurological:     Mental  Status: He is alert.     Cranial Nerves: No cranial nerve deficit.     Sensory: No sensory deficit.     Motor: No weakness (5/5 strength in bilateral upper and lower extremities to major muscle groups.) or abnormal muscle tone.  Comments: Sensation intact to light touch in bilateral arms and legs.  Psychiatric:        Behavior: Behavior normal.      ED Treatments / Results  Labs (all labs ordered are listed, but only abnormal results are displayed) Labs Reviewed  BASIC METABOLIC PANEL - Abnormal; Notable for the following components:      Result Value   Glucose, Bld 101 (*)    All other components within normal limits  CBC - Abnormal; Notable for the following components:   RDW 11.2 (*)    All other components within normal limits  TROPONIN I    EKG EKG Interpretation  Date/Time:  Tuesday January 02 2019 12:38:32 EDT Ventricular Rate:  80 PR Interval:  164 QRS Duration: 82 QT Interval:  344 QTC Calculation: 396 R Axis:   65 Text Interpretation:  Normal sinus rhythm Normal ECG No old tracing to compare No apparent ischemia or infarct Confirmed by Duffy Bruce [phone]) on 01/02/2019 2:43:20 PM Also confirmed by Gareth Morgan [phone])  on 01/02/2019 3:13:01 PM   Radiology Dg Chest 2 View  Result Date: 01/02/2019 CLINICAL DATA:  Left-sided chest pain. Left arm tingling and posterior shoulder pain. EXAM: CHEST - 2 VIEW COMPARISON:  None. FINDINGS: The heart size and mediastinal contours are within normal limits. Both lungs are clear. The visualized skeletal structures are unremarkable. IMPRESSION: Normal exam. Electronically Signed   By: Lorriane Shire M.D.   On: 01/02/2019 14:02    Procedures Procedures (including critical care time)  Medications Ordered in ED Medications  sodium chloride flush (NS) 0.9 % injection 3 mL (has no administration in time range)     Initial Impression / Assessment and Plan / ED Course  I have reviewed the triage vital signs and the  nursing notes.  Pertinent labs & imaging results that were available during my care of the patient were reviewed by me and considered in my medical decision making (see chart for details).       Patient presents today for evaluation of intermittent, primarily left-sided, chest pain and tingling in his left arm, my evaluation his tingling and chest pain have both fully resolved.  He has tenderness to palpation over the left side of his chest along the anterior sternal costal joints which both re-creates and exacerbates his reported pain.  He has 5/5 strength in bilateral upper extremities and sensation intact to light touch.  Chest x-ray obtained without evidence of acute abnormalities.  EKG obtained and reviewed without indications of arrhythmia, ischemia, or other acute changes.  He is PERC negative.  His troponin is not elevated.  CBC and BMP reviewed without significant electrolyte or hematologic derangements.  He is slightly hypertensive with a blood pressure of 144/90, however does not currently have any symptoms, do not suspect hypertensive emergency.  He is PERC negative.  Do not suspect PE, or ACS.  He reports his pain started after he rubbed his chest and is relieved with a hot shower.  Suspect MSK cause of pain.  Heart score is 0.    Recommended conservative care, heat, PCP follow up.    Return precautions were discussed with patient who states their understanding.  At the time of discharge patient denied any unaddressed complaints or concerns.  Patient is agreeable for discharge home.   Final Clinical Impressions(s) / ED Diagnoses   Final diagnoses:  Atypical chest pain    ED Discharge Orders    None       Phylliss Bob,  Ree Shay, PA-C 01/02/19 1716    Gareth Morgan, MD 01/06/19 2131

## 2019-01-02 NOTE — ED Notes (Signed)
Patient verbalizes understanding of discharge instructions. Opportunity for questioning and answers were provided. Pt discharged from ED. 

## 2019-01-02 NOTE — ED Triage Notes (Signed)
Pt in c/o L arm and L sided Chest tingling x 4 days, denies SOB, denies n/v/d, A&O x4

## 2019-01-02 NOTE — ED Notes (Signed)
Pt's Latron Ribas [phone]. Pls contact for status update or D/C

## 2019-02-16 ENCOUNTER — Encounter (HOSPITAL_COMMUNITY): Payer: Self-pay | Admitting: Emergency Medicine

## 2019-02-16 ENCOUNTER — Emergency Department (HOSPITAL_COMMUNITY): Payer: Medicare Other

## 2019-02-16 ENCOUNTER — Emergency Department (HOSPITAL_COMMUNITY)
Admission: EM | Admit: 2019-02-16 | Discharge: 2019-02-16 | Disposition: A | Discharge status: Home / Self Care | Payer: Medicare Other | Attending: Emergency Medicine | Admitting: Emergency Medicine

## 2019-02-16 ENCOUNTER — Other Ambulatory Visit: Payer: Self-pay

## 2019-02-16 DIAGNOSIS — R0789 Other chest pain: Secondary | ICD-10-CM | POA: Insufficient documentation

## 2019-02-16 DIAGNOSIS — Z79899 Other long term (current) drug therapy: Secondary | ICD-10-CM | POA: Insufficient documentation

## 2019-02-16 LAB — BASIC METABOLIC PANEL
Anion gap: 12 (ref 5–15)
BUN: 8 mg/dL (ref 6–20)
CO2: 24 mmol/L (ref 22–32)
Calcium: 9.5 mg/dL (ref 8.9–10.3)
Chloride: 99 mmol/L (ref 98–111)
Creatinine, Ser: 1.06 mg/dL (ref 0.61–1.24)
GFR calc Af Amer: >60 mL/min (ref >60)
GFR calc non Af Amer: >60 mL/min (ref >60)
Glucose, Bld: 96 mg/dL (ref 70–99)
Potassium: 4.4 mmol/L (ref 3.5–5.1)
Sodium: 135 mmol/L (ref 135–145)

## 2019-02-16 LAB — CBC
HCT: 45.8 % (ref 39.0–52.0)
Hemoglobin: 16.1 g/dL (ref 13.0–17.0)
MCH: 31.7 pg (ref 26.0–34.0)
MCHC: 35.2 g/dL (ref 30.0–36.0)
MCV: 90.2 fL (ref 80.0–100.0)
Platelets: 208 10*3/uL (ref 150–400)
RBC: 5.08 MIL/uL (ref 4.22–5.81)
RDW: 11.7 % (ref 11.5–15.5)
WBC: 7.1 10*3/uL (ref 4.0–10.5)
nRBC: 0 % (ref 0.0–0.2)

## 2019-02-16 LAB — TROPONIN I: Troponin I: <0.03 ng/mL (ref <0.03)

## 2019-02-16 NOTE — Discharge Instructions (Signed)
Your labs, xray, and EKG are all reassuring, your chest pain could be a variety of things including gas pain, muscle pain, indigestion, and other nonemergent issues. Alternate between tylenol and motrin as directed as needed for pain, always taking these on a full stomach and NEVER on an empty stomach. Stay well hydrated. You may consider using heat to the areas of pain, no more than 20 minutes every hour. If you think indigestion could be contributing to your symptoms, you may use over the counter tums, maalox, pepto bismol, zantac, etc to help with symptoms; avoid spicy/fatty/fried/acidic foods. Follow up with your regular doctor in 1 week for recheck of symptoms. Return to the ER for changes or worsening symptoms.  SEEK IMMEDIATE MEDICAL ATTENTION IF: You develop a fever.  Your chest pains become severe or intolerable.  You develop new, unexplained symptoms (problems).  You develop shortness of breath, nausea, vomiting, sweating or feel light headed.  You develop a new cough or you cough up blood. You develop new leg swelling

## 2019-02-16 NOTE — ED Triage Notes (Signed)
Pt in with c/o L chest/musculoskeletal pain x a few days. Having L arm tingling. Was seen by doc recently for pinched nerve. Hx of epiliepsy

## 2019-02-16 NOTE — ED Provider Notes (Signed)
[facility] EMERGENCY DEPARTMENT Provider Note   CSN: [id number] Arrival date & time: 02/16/19  1204    History   Chief Complaint Chief Complaint  Patient presents with  . Back Pain  . Muscle Pain  . Chest Pain    HPI    Zachary Patrick is a 33 y.o. male with a PMHx of epilepsy, who presents to the ED with complaints of intermittent chest pain that began 6.5 weeks ago.  Patient states that he was seen at the ED on 01/02/2019 for left arm tingling and left-sided chest pain, his work-up at that time was unremarkable and he was discharged home.  He states that the tingling in his arm has resolved but he still has chest pain and it has now spread from the left side of his chest into the anterior part of his chest and somewhat into his back so he came back for reassessment.  He describes his pain as 3/10 intermittent sore with occasional sharp chest pain mostly in the left side of his chest but somewhat into the upper abdomen area and into his upper back, worse with movement, and with no treatments tried prior to arrival.  He states that his tingling has resolved.  He denies any lightheadedness, diaphoresis, fevers, chills, cough, URI symptoms, shortness of breath, leg swelling, recent travel/surgery/immobilization, personal or family history of DVT/PE, abdominal pain, nausea, vomiting, diarrhea, constipation, dysuria, hematuria, numbness, tingling, focal weakness, claudication, orthopnea, or any other complaints at this time.  No known family history of cardiac disease in first-degree relatives.  He is a non-smoker.  The history is provided by the patient and medical records. No language interpreter was used.  Back Pain  Associated symptoms: chest pain   Associated symptoms: no abdominal pain, no dysuria, no fever, no numbness and no weakness   Muscle Pain  Associated symptoms include chest pain. Pertinent negatives include no abdominal pain and no shortness of breath.   Chest Pain  Associated symptoms: no abdominal pain, no cough, no diaphoresis, no fever, no nausea, no numbness, no shortness of breath, no vomiting and no weakness     Past Medical History:  Diagnosis Date  . Seizures ([place])     There are no active problems to display for this patient.   History reviewed. No pertinent surgical history.      Home Medications    Prior to Admission medications   Medication Sig Start Date End Date Taking? Authorizing Provider  lacosamide (VIMPAT) 200 MG TABS Take 200 mg by mouth 2 (two) times daily.    [provider]  levETIRAcetam (KEPPRA) 1000 MG tablet Take 2,000 mg by mouth 2 (two) times daily.     [provider]  oxcarbazepine (TRILEPTAL) 600 MG tablet Take 1,200 mg by mouth 2 (two) times daily.     [provider]    Family History No family history on file.  Social History Social History   Tobacco Use  . Smoking status: Never Smoker  . Smokeless tobacco: Never Used  Substance Use Topics  . Alcohol use: Yes  . Drug use: No     Allergies   Patient has no known allergies.   Review of Systems Review of Systems  Constitutional: Negative for chills, diaphoresis and fever.  HENT: Negative for rhinorrhea and sore throat.   Respiratory: Negative for cough and shortness of breath.   Cardiovascular: Positive for chest pain. Negative for leg swelling.  Gastrointestinal: Negative for abdominal pain, constipation, diarrhea,  nausea and vomiting.  Genitourinary: Negative for dysuria and hematuria.  Musculoskeletal: Negative for arthralgias and myalgias.  Skin: Negative for color change.  Allergic/Immunologic: Negative for immunocompromised state.  Neurological: Negative for weakness, light-headedness and numbness.  Psychiatric/Behavioral: Negative for confusion.   All other systems reviewed and are negative for acute change except as noted in the HPI.    Physical Exam Updated Vital Signs BP (!) 148/92 (BP  Location: Right Arm)   Pulse 73   Temp 98.7 F (37.1 C) (Oral)   Resp 16   Wt 95.3 kg   SpO2 98%   BMI 27.72 kg/m   Physical Exam Vitals signs and nursing note reviewed.  Constitutional:      General: He is not in acute distress.    Appearance: Normal appearance. He is well-developed. He is not toxic-appearing.     Comments: Afebrile, nontoxic, NAD  HENT:     Head: Normocephalic and atraumatic.  Eyes:     General:        Right eye: No discharge.        Left eye: No discharge.     Conjunctiva/sclera: Conjunctivae normal.  Neck:     Musculoskeletal: Normal range of motion and neck supple.  Cardiovascular:     Rate and Rhythm: Normal rate and regular rhythm.     Pulses: Normal pulses.     Heart sounds: Normal heart sounds, S1 normal and S2 normal. No murmur. No friction rub. No gallop.      Comments: RRR, nl s1/s2, no m/r/g, distal pulses intact, no pedal edema  Pulmonary:     Effort: Pulmonary effort is normal. No respiratory distress.     Breath sounds: Normal breath sounds. No decreased breath sounds, wheezing, rhonchi or rales.     Comments: CTAB in all lung fields, no w/r/r, no hypoxia or increased WOB, speaking in full sentences, SpO2 98% on RA  Chest:     Chest wall: Tenderness present. No deformity, swelling or crepitus.       Comments: Chest wall with mild TTP most focally to the L anterolateral chest wall, without swelling, without crepitus, deformities, or retractions, no overlying skin changes.  Abdominal:     General: Bowel sounds are normal. There is no distension.     Palpations: Abdomen is soft. Abdomen is not rigid.     Tenderness: There is no abdominal tenderness. There is no right CVA tenderness, left CVA tenderness, guarding or rebound. Negative signs include Murphy's sign and McBurney's sign.     Comments: Soft, NTND, +BS throughout, no r/g/r, neg murphy's, neg mcburney's, no CVA TTP   Musculoskeletal: Normal range of motion.     Comments: No midline  spinal tenderness MAE x4 Strength and sensation grossly intact in all extremities Distal pulses intact No pedal edema, neg homan's bilaterally   Skin:    General: Skin is warm and dry.     Findings: No rash.  Neurological:     Mental Status: He is alert and oriented to person, place, and time.     Sensory: Sensation is intact. No sensory deficit.     Motor: Motor function is intact.  Psychiatric:        Mood and Affect: Mood and affect normal.        Behavior: Behavior normal.      ED Treatments / Results  Labs (all labs ordered are listed, but only abnormal results are displayed) Labs Reviewed  BASIC METABOLIC PANEL  CBC  TROPONIN I  EKG EKG Interpretation  Date/Time:  Friday Feb 16 2019 12:27:50 EDT Ventricular Rate:  74 PR Interval:  162 QRS Duration: 78 QT Interval:  370 QTC Calculation: 410 R Axis:   52 Text Interpretation:  Normal sinus rhythm Normal ECG since last tracing no significant change Confirmed by Daleen Bo [phone]) on 02/16/2019 2:44:43 PM   Radiology Dg Chest 2 View  Result Date: 02/16/2019 CLINICAL DATA:  Chest pain. EXAM: CHEST - 2 VIEW COMPARISON:  Radiographs of January 02, 2019. FINDINGS: The heart size and mediastinal contours are within normal limits. Both lungs are clear. No pneumothorax or pleural effusion is noted. The visualized skeletal structures are unremarkable. IMPRESSION: No active cardiopulmonary disease. Electronically Signed   By: Marijo Conception M.D.   On: 02/16/2019 12:57    Procedures Procedures (including critical care time)  Medications Ordered in ED Medications - No data to display   Initial Impression / Assessment and Plan / ED Course  I have reviewed the triage vital signs and the nursing notes.  Pertinent labs & imaging results that were available during my care of the patient were reviewed by me and considered in my medical decision making (see chart for details).        33 y.o. male here with 6.5wks of  chest wall pain. Was seen 01/02/19 for L chest pain and L arm tingling, work up reassuring at that time, states L arm tingling resolved but still having L chest pain now spreading to the upper abd/lower anterior chest and somewhat into his back. On exam, mild diffuse L chest wall TTP, no abdominal tenderness, no flank tenderness, no spinal tenderness, extremities NVI with soft compartments, no pedal edema, PERC neg. EKG nonischemic. CXR neg. CBC, BMP, and trop all WNL. Suspect MsK etiology, could be pulled muscle in rib cage since there is point tenderness to the L chest. Doubt ACS, dissection, PE, etc. Doubt need for further emergent work up. Advised OTC remedies for symptomatic relief, and f/up with PCP in 1wk for recheck. I explained the diagnosis and have given explicit precautions to return to the ER including for any other new or worsening symptoms. The patient understands and accepts the medical plan as it's been dictated and I have answered their questions. Discharge instructions concerning home care and prescriptions have been given. The patient is STABLE and is discharged to home in good condition.    Final Clinical Impressions(s) / ED Diagnoses   Final diagnoses:  Chest wall pain    ED Discharge Orders    [address]es, [place], [place] 02/16/19 1459    Daleen Bo, MD 02/17/19 [phone]

## 2020-12-08 ENCOUNTER — Emergency Department (HOSPITAL_COMMUNITY)
Admission: EM | Admit: 2020-12-08 | Discharge: 2020-12-08 | Disposition: A | Discharge status: Home / Self Care | Payer: Medicare Other | Attending: Emergency Medicine | Admitting: Emergency Medicine

## 2020-12-08 ENCOUNTER — Other Ambulatory Visit: Payer: Self-pay

## 2020-12-08 ENCOUNTER — Encounter (HOSPITAL_COMMUNITY): Payer: Self-pay

## 2020-12-08 DIAGNOSIS — R202 Paresthesia of skin: Secondary | ICD-10-CM | POA: Diagnosis present

## 2020-12-08 MED ORDER — PREDNISONE 20 MG PO TABS: 40.0000 mg | ORAL_TABLET | Freq: Every day | ORAL | 0 refills | Status: DC

## 2020-12-08 NOTE — ED Provider Notes (Signed)
[facility] EMERGENCY DEPARTMENT Provider Note   CSN: [id number] Arrival date & time: 12/08/20  0324     History Chief Complaint  Patient presents with  . Numbness    Zachary Patrick is a 35 y.o. male.  Patient presents to the emergency department with a chief complaint of left arm tingling.  He states that he has been having the symptoms intermittently for the past several weeks.  He states that he has had some pain in his left upper shoulder.  He states that the symptoms have resolved now.  He states that he thinks he has a pinched nerve.  He also reports that he has a neuropace, for seizure, but denies any seizure activity.  The history is provided by the patient. No language interpreter was used.       Past Medical History:  Diagnosis Date  . Seizures ([place])     There are no problems to display for this patient.   History reviewed. No pertinent surgical history.     History reviewed. No pertinent family history.  Social History   Tobacco Use  . Smoking status: Never Smoker  . Smokeless tobacco: Never Used  Substance Use Topics  . Alcohol use: Yes  . Drug use: Yes    Types: Marijuana    Home Medications Prior to Admission medications   Medication Sig Start Date End Date Taking? Authorizing Provider  predniSONE (DELTASONE) 20 MG tablet Take 2 tablets (40 mg total) by mouth daily. Take 40 mg by mouth daily for 3 days, then 20mg  by mouth daily for 3 days, then 10mg  daily for 3 days 12/08/20  Yes Montine Circle, PA-C  lacosamide (VIMPAT) 200 MG TABS Take 200 mg by mouth 2 (two) times daily.    [provider]  levETIRAcetam (KEPPRA) 1000 MG tablet Take 2,000 mg by mouth 2 (two) times daily.     [provider]  oxcarbazepine (TRILEPTAL) 600 MG tablet Take 1,200 mg by mouth 2 (two) times daily.     [provider]    Allergies    Patient has no known allergies.  Review of Systems   Review of Systems  All other  systems reviewed and are negative.   Physical Exam Updated Vital Signs BP 116/82   Pulse 66   Temp 98.7 F (37.1 C)   Resp 20   Ht 6\' 1"  (1.854 m)   SpO2 97%   BMI 27.72 kg/m   Physical Exam Vitals and nursing note reviewed.  Constitutional:      General: He is not in acute distress.    Appearance: He is well-developed. He is not ill-appearing.  HENT:     Head: Normocephalic and atraumatic.  Eyes:     Conjunctiva/sclera: Conjunctivae normal.  Cardiovascular:     Rate and Rhythm: Normal rate.  Pulmonary:     Effort: Pulmonary effort is normal. No respiratory distress.  Abdominal:     General: There is no distension.  Musculoskeletal:     Cervical back: Neck supple.     Comments: Moves all extremities Normal strength of left upper extremity  Skin:    General: Skin is warm and dry.  Neurological:     Mental Status: He is alert and oriented to person, place, and time.     Comments: Normal sensation of left upper arm  Psychiatric:        Mood and Affect: Mood normal.        Behavior: Behavior normal.  ED Results / Procedures / Treatments   Labs (all labs ordered are listed, but only abnormal results are displayed) Labs Reviewed - No data to display  EKG None  Radiology No results found.  Procedures Procedures   Medications Ordered in ED Medications - No data to display  ED Course  I have reviewed the triage vital signs and the nursing notes.  Pertinent labs & imaging results that were available during my care of the patient were reviewed by me and considered in my medical decision making (see chart for details).    MDM Rules/Calculators/A&P                          Patient here with intermittent numbness and tingling in his left arm.  He thinks that he may have pinched a nerve.  He does have some tenderness palpation over his left upper trapezius.  He denies any neck pain.  He states that the numbness sensation has ceased.  He does not have any  focal deficit on my exam.  Will give short course of prednisone for cervical radiculopathy.  Recommend follow-up with ortho and with his neurologist. Final Clinical Impression(s) / ED Diagnoses Final diagnoses:  Paresthesia    Rx / DC Orders ED Discharge Orders         Ordered    predniSONE (DELTASONE) 20 MG tablet  Daily        12/08/20 0552           Montine Circle, PA-C 39/53/20 2334    Delora Fuel, MD 35/68/61 2243

## 2020-12-08 NOTE — ED Triage Notes (Signed)
Pt reports that he woke up at 0230 and left arm felt numb. Numbness was to the left FA. Numbness has resolved.

## 2020-12-08 NOTE — ED Notes (Signed)
Patient verbalized understanding of discharge instructions. Opportunity for questions and answers.  

## 2020-12-08 NOTE — ED Notes (Signed)
Pt reports numbness on his left forearm as well as occasionally on other extremities.

## 2020-12-08 NOTE — ED Triage Notes (Signed)
Pt reports that he has been having intermittent tingling to left FA since 2019.

## 2020-12-28 ENCOUNTER — Encounter (HOSPITAL_COMMUNITY): Payer: Self-pay | Admitting: Emergency Medicine

## 2020-12-28 ENCOUNTER — Emergency Department (HOSPITAL_COMMUNITY)
Admission: EM | Admit: 2020-12-28 | Discharge: 2020-12-28 | Disposition: A | Discharge status: Home / Self Care | Payer: Medicare Other | Attending: Emergency Medicine | Admitting: Emergency Medicine

## 2020-12-28 ENCOUNTER — Other Ambulatory Visit: Payer: Self-pay

## 2020-12-28 ENCOUNTER — Emergency Department (HOSPITAL_COMMUNITY): Payer: Medicare Other

## 2020-12-28 DIAGNOSIS — R0789 Other chest pain: Secondary | ICD-10-CM | POA: Diagnosis not present

## 2020-12-28 DIAGNOSIS — R079 Chest pain, unspecified: Secondary | ICD-10-CM | POA: Diagnosis present

## 2020-12-28 DIAGNOSIS — R202 Paresthesia of skin: Secondary | ICD-10-CM

## 2020-12-28 LAB — CBC WITH DIFFERENTIAL/PLATELET
Abs Immature Granulocytes: 0.02 10*3/uL (ref 0.00–0.07)
Basophils Absolute: 0.1 10*3/uL (ref 0.0–0.1)
Basophils Relative: 1 %
Eosinophils Absolute: 0.1 10*3/uL (ref 0.0–0.5)
Eosinophils Relative: 1 %
HCT: 47.1 % (ref 39.0–52.0)
Hemoglobin: 16.3 g/dL (ref 13.0–17.0)
Immature Granulocytes: 0 %
Lymphocytes Relative: 26 %
Lymphs Abs: 2.1 10*3/uL (ref 0.7–4.0)
MCH: 31.3 pg (ref 26.0–34.0)
MCHC: 34.6 g/dL (ref 30.0–36.0)
MCV: 90.4 fL (ref 80.0–100.0)
Monocytes Absolute: 0.5 10*3/uL (ref 0.1–1.0)
Monocytes Relative: 7 %
Neutro Abs: 5.2 10*3/uL (ref 1.7–7.7)
Neutrophils Relative %: 65 %
Platelets: 228 10*3/uL (ref 150–400)
RBC: 5.21 MIL/uL (ref 4.22–5.81)
RDW: 11.7 % (ref 11.5–15.5)
WBC: 8 10*3/uL (ref 4.0–10.5)
nRBC: 0 % (ref 0.0–0.2)

## 2020-12-28 LAB — COMPREHENSIVE METABOLIC PANEL
ALT: 21 U/L (ref 0–44)
AST: 18 U/L (ref 15–41)
Albumin: 4.3 g/dL (ref 3.5–5.0)
Alkaline Phosphatase: 87 U/L (ref 38–126)
Anion gap: 6 (ref 5–15)
BUN: 7 mg/dL (ref 6–20)
CO2: 29 mmol/L (ref 22–32)
Calcium: 9.1 mg/dL (ref 8.9–10.3)
Chloride: 103 mmol/L (ref 98–111)
Creatinine, Ser: 1.42 mg/dL — ABNORMAL HIGH (ref 0.61–1.24)
GFR, Estimated: >60 mL/min (ref >60)
Glucose, Bld: 98 mg/dL (ref 70–99)
Potassium: 4.2 mmol/L (ref 3.5–5.1)
Sodium: 138 mmol/L (ref 135–145)
Total Bilirubin: 0.6 mg/dL (ref 0.3–1.2)
Total Protein: 7.3 g/dL (ref 6.5–8.1)

## 2020-12-28 LAB — TROPONIN I (HIGH SENSITIVITY): Troponin I (High Sensitivity): 4 ng/L (ref <18)

## 2020-12-28 LAB — LIPASE, BLOOD: Lipase: 34 U/L (ref 11–51)

## 2020-12-28 NOTE — ED Provider Notes (Signed)
Patient placed in [facility] pathway, seen and evaluated   Chief Complaint: Chest pain, left arm numbness  HPI:   35 year old male presents to the ED reporting intermittent chest pain and a numb feeling in his left arm that comes and goes.  He reports that he has had pain in his abdomen off and on for the past 3 to 4 years and intermittently this pain will shoot up into his chest and now he is having some tingling in his arm and he started to worry that he could be having a heart attack.  No prior cardiac history.  ROS: + Chest pain, no pain, nausea, numbness  Physical Exam:   Gen: No distress  Neuro: Awake and Alert  Skin: Warm    Focused Exam: Well-appearing, heart with regular rate, respirations equal and normal, lungs clear   Initiation of care has begun. The patient has been counseled on the process, plan, and necessity for staying for the completion/evaluation, and the remainder of the medical screening examination   MSE was initiated and I personally evaluated the patient and placed orders (if any) at  12:39 PM on December 28, 2020.  The patient appears stable so that the remainder of the MSE may be completed by another provider.   Jacqlyn Larsen, PA-C 12/28/20 1245    Arnaldo Natal, MD 12/28/20 [phone]

## 2020-12-28 NOTE — Discharge Instructions (Addendum)
As we discussed, your lab work, EKG looked reassuring today.  It is important for you to follow-up with your neurologist.  Please make appointment.  Follow-up with [facility] to establish a primary care doctor.  Return to emergency department for any worsening pain, difficulty breathing, inability to move your arms or legs, difficulty speaking or any other worsening concerning symptoms.

## 2020-12-28 NOTE — ED Provider Notes (Signed)
[facility] EMERGENCY DEPARTMENT Provider Note   CSN: [id number] Arrival date & time: 12/28/20  1227     History Chief Complaint  Patient presents with  . Abdominal Pain  . Chest Pain    Zachary Patrick is a 35 y.o. male with PMH/o seizures who presents for evaluation of left upper extremity tingling/numbness that has been ongoing for 3 years and chest pain that has been ongoing for 3 days.  He states that he has had intermittent numbness/tingling to his left upper extremity that has been an ongoing issue for 3 years.  He states that he has been told it is a pinched nerve but he states it continues to bother him.  He was seen in the ED last month for evaluation of symptoms and told to follow-up with her neurologist.  He states that he has not followed up with him.  He denies any weakness of the upper extremity.  He states occasionally, he will have some tingling of his foot but denies any numbness/weakness of his lower extremities.  He reports 3 days ago, he started developing some chest pain.  He states that he noticed when he bit into a sandwich, he had some chest pain.  He has been able to swallow and tolerate secretions.  He states that since then, he has had intermittent chest pain on the left side of his chest.  He states that sometimes it is worse when he moves, bends a certain way.  No associated nausea/vomiting/diaphoresis.  No shortness of breath.  Chest pain is not worse with exertion, deep inspiration.  He wanted to check out to make sure he was not having a heart attack.  He currently denies any chest pain right now.  Currently denies any left upper extremity numbness.  He has been able ambulate without any difficulty.  He denies any fevers, cough, chills.  He also reports that occasionally, he was having some upper abdominal pain.  He states that is currently resolved.  He has not had any associated nausea/vomiting.  He smokes marijuana occasionally.  Does not smoke  any cigarettes.  No personal cardiac history.  No family history of MI before the age of 49. He denies any exogenous hormone use, recent immobilization, prior history of DVT/PE, recent surgery, leg swelling, or long travel. He denies any h/o DM, HTN.   The history is provided by the patient.       Past Medical History:  Diagnosis Date  . Seizures ([place])     There are no problems to display for this patient.   History reviewed. No pertinent surgical history.     No family history on file.  Social History   Tobacco Use  . Smoking status: Never Smoker  . Smokeless tobacco: Never Used  Substance Use Topics  . Alcohol use: Yes  . Drug use: Yes    Types: Marijuana    Home Medications Prior to Admission medications   Medication Sig Start Date End Date Taking? Authorizing Provider  lacosamide (VIMPAT) 200 MG TABS Take 200 mg by mouth 2 (two) times daily.    [provider]  levETIRAcetam (KEPPRA) 1000 MG tablet Take 2,000 mg by mouth 2 (two) times daily.     [provider]  oxcarbazepine (TRILEPTAL) 600 MG tablet Take 1,200 mg by mouth 2 (two) times daily.     [provider]  predniSONE (DELTASONE) 20 MG tablet Take 2 tablets (40 mg total) by mouth daily. Take 40 mg  by mouth daily for 3 days, then 20mg  by mouth daily for 3 days, then 10mg  daily for 3 days 12/08/20   Montine Circle, PA-C    Allergies    Patient has no known allergies.  Review of Systems   Review of Systems  Constitutional: Negative for fever.  Respiratory: Negative for cough and shortness of breath.   Cardiovascular: Positive for chest pain.  Gastrointestinal: Positive for abdominal pain (resolved). Negative for nausea and vomiting.  Genitourinary: Negative for dysuria and hematuria.  Neurological: Positive for numbness (chronic). Negative for weakness and headaches.  All other systems reviewed and are negative.   Physical Exam Updated Vital Signs BP 136/84 (BP Location:  Right Arm)   Pulse 69   Temp 98.3 F (36.8 C)   Resp 20   SpO2 96%   Physical Exam Vitals and nursing note reviewed.  Constitutional:      Appearance: Normal appearance. He is well-developed.  HENT:     Head: Normocephalic and atraumatic.  Eyes:     General: Lids are normal.     Conjunctiva/sclera: Conjunctivae normal.     Pupils: Pupils are equal, round, and reactive to light.     Comments: PERRL. EOMs intact. No nystagmus. No neglect.   Cardiovascular:     Rate and Rhythm: Normal rate and regular rhythm.     Pulses: Normal pulses.          Radial pulses are 2+ on the right side and 2+ on the left side.       Dorsalis pedis pulses are 2+ on the right side and 2+ on the left side.     Heart sounds: Normal heart sounds. No murmur heard. No friction rub. No gallop.   Pulmonary:     Effort: Pulmonary effort is normal.     Breath sounds: Normal breath sounds.     Comments: Lungs clear to auscultation bilaterally.  Symmetric chest rise.  No wheezing, rales, rhonchi. Abdominal:     Palpations: Abdomen is soft. Abdomen is not rigid.     Tenderness: There is no abdominal tenderness. There is no guarding.     Comments: Abdomen is soft, non-distended, non-tender. No rigidity, No guarding. No peritoneal signs.  Musculoskeletal:        General: Normal range of motion.     Cervical back: Full passive range of motion without pain.  Skin:    General: Skin is warm and dry.     Capillary Refill: Capillary refill takes less than 2 seconds.     Comments: Good distal cap refill. LUE is not dusky in appearance or cool to touch.  Neurological:     Mental Status: He is alert and oriented to person, place, and time.     Comments: Cranial nerves III-XII intact Follows commands, Moves all extremities  5/5 strength to BUE and BLE  Sensation intact throughout all major nerve distributions. Denies any current numbness to LUE.  No slurred speech. No facial droop.   Psychiatric:        Speech:  Speech normal.     ED Results / Procedures / Treatments   Labs (all labs ordered are listed, but only abnormal results are displayed) Labs Reviewed  COMPREHENSIVE METABOLIC PANEL - Abnormal; Notable for the following components:      Result Value   Creatinine, Ser 1.42 (*)    All other components within normal limits  LIPASE, BLOOD  CBC WITH DIFFERENTIAL/PLATELET  TROPONIN I (HIGH SENSITIVITY)  TROPONIN I (HIGH SENSITIVITY)  EKG EKG Interpretation  Date/Time:  Sunday December 28 2020 12:47:11 EDT Ventricular Rate:  83 PR Interval:  166 QRS Duration: 82 QT Interval:  354 QTC Calculation: 415 R Axis:   65 Text Interpretation: Normal sinus rhythm Normal ECG Confirmed by Curatolo, Adam ([id number]) on 12/28/2020 2:34:38 PM   Radiology DG Chest 2 View  Result Date: 12/28/2020 CLINICAL DATA:  Chest pain. EXAM: CHEST - 2 VIEW COMPARISON:  Feb 16, 2019. FINDINGS: The heart size and mediastinal contours are within normal limits. Both lungs are clear. No visible pleural effusions or pneumothorax. No acute osseous abnormality. IMPRESSION: No active cardiopulmonary disease. Electronically Signed   By: Frederick S Jones MD   On: 12/28/2020 13:40    Procedures Procedures   Medications Ordered in ED Medications - No data to display  ED Course  I have reviewed the triage vital signs and the nursing notes.  Pertinent labs & imaging results that were available during my care of the patient were reviewed by me and considered in my medical decision making (see chart for details).    MDM Rules/Calculators/A&P                          34  year old male who presents for evaluation of numbness/tingling that has been ongoing for 3 years to his left upper extremity.  He also reports he has been having chest pain over the last 3 days.  He also reports initially, he was having some abdominal pain but states that is resolved.  He wanted to get it checked out.  He does not have a primary care doctor he is  followed up with.  On initial arrival, he is afebrile, toxic appearing.  Vital signs are stable.  On exam, he has a benign abdominal exam.  States he currently is not having any abdominal pain.  He has equal pulses in all 4 extremities in all 4 extremities are warm with good cap refill.  Neuro exam is unremarkable without any signs of deficits.  History/physical exam is not concerning for ACS etiology.  He has very atypical features.  He is slightly overweight but has no other risk factors for cardiovascular disease.  Additionally, history/physical exam not concerning for PE.  He does not have any risk factors, pleuritic chest pain.  History/physical exam concerning for dissection.  History/physical exam concerning for ischemic limb, septic arthritis.  Additionally, he reports that this numbness/tingling has been ongoing for several years.  He has a reassuring neuro exam, do not suspect acute CVA as etiology of his symptoms.  Labs, chest x-ray, EKG ordered at triage.  Troponin is negative.  CMP shows BUN of 7, creatinine 1.42.  CBC shows no leukocytosis.  Hemoglobin stable.  Lipase unremarkable.  Chest x-ray negative for any acute abnormalities.  At this time, patient is hemodynamically stable.  He has very atypical features of his chest pain, and besides overweight, does not have risk factors for cardiovascular disease.  At this time, feel that 1 troponin is sufficient for ACS rule out as has been ongoing for 3 days.  I discussed this with patient.  At this time, no indication for further work-up.  I did instruct patient that would be beneficial to follow-up with his neurologist regarding his ongoing numbness/tingling of his left upper extremity. At this time, patient exhibits no emergent life-threatening condition that require further evaluation in ED. Patient had ample opportunity for questions and discussion. All patient's questions were answered with full understanding.  Strict return precautions  discussed. Patient expresses understanding and agreement to plan.   Portions of this note were generated with [employer]. Dictation errors may occur despite best attempts at proofreading.  Final Clinical Impression(s) / ED Diagnoses Final diagnoses:  Atypical chest pain  Paresthesia    Rx / DC Orders ED Discharge Orders    None       Volanda Napoleon, PA-C 12/28/20 [place], [place], DO 12/29/20 [phone]

## 2020-12-28 NOTE — ED Notes (Signed)
Patient states his left arm has been tinglingand feeling funny for 3 years, c/o chest pain and abd. With radiation to his left arm this has been going on for 3 years also. States he gets SOB if he gets up to fast.

## 2020-12-28 NOTE — ED Triage Notes (Signed)
C/o intermittent abd pain that radiates to chest with L arm tingling and nausea.

## 2021-01-15 ENCOUNTER — Emergency Department (HOSPITAL_COMMUNITY): Payer: Medicare Other

## 2021-01-15 ENCOUNTER — Emergency Department (HOSPITAL_COMMUNITY)
Admission: EM | Admit: 2021-01-15 | Discharge: 2021-01-15 | Disposition: A | Discharge status: Home / Self Care | Payer: Medicare Other | Attending: Emergency Medicine | Admitting: Emergency Medicine

## 2021-01-15 DIAGNOSIS — E871 Hypo-osmolality and hyponatremia: Secondary | ICD-10-CM | POA: Diagnosis not present

## 2021-01-15 DIAGNOSIS — R0600 Dyspnea, unspecified: Secondary | ICD-10-CM | POA: Insufficient documentation

## 2021-01-15 DIAGNOSIS — R61 Generalized hyperhidrosis: Secondary | ICD-10-CM | POA: Insufficient documentation

## 2021-01-15 LAB — BASIC METABOLIC PANEL
Anion gap: 6 (ref 5–15)
BUN: 11 mg/dL (ref 6–20)
CO2: 27 mmol/L (ref 22–32)
Calcium: 9 mg/dL (ref 8.9–10.3)
Chloride: 100 mmol/L (ref 98–111)
Creatinine, Ser: 1.12 mg/dL (ref 0.61–1.24)
GFR, Estimated: >60 mL/min (ref >60)
Glucose, Bld: 104 mg/dL — ABNORMAL HIGH (ref 70–99)
Potassium: 4.8 mmol/L (ref 3.5–5.1)
Sodium: 133 mmol/L — ABNORMAL LOW (ref 135–145)

## 2021-01-15 LAB — CBC WITH DIFFERENTIAL/PLATELET
Abs Immature Granulocytes: 0.02 10*3/uL (ref 0.00–0.07)
Basophils Absolute: 0.1 10*3/uL (ref 0.0–0.1)
Basophils Relative: 1 %
Eosinophils Absolute: 0.1 10*3/uL (ref 0.0–0.5)
Eosinophils Relative: 1 %
HCT: 45.7 % (ref 39.0–52.0)
Hemoglobin: 15.9 g/dL (ref 13.0–17.0)
Immature Granulocytes: 0 %
Lymphocytes Relative: 24 %
Lymphs Abs: 1.7 10*3/uL (ref 0.7–4.0)
MCH: 31.7 pg (ref 26.0–34.0)
MCHC: 34.8 g/dL (ref 30.0–36.0)
MCV: 91.2 fL (ref 80.0–100.0)
Monocytes Absolute: 0.6 10*3/uL (ref 0.1–1.0)
Monocytes Relative: 9 %
Neutro Abs: 4.6 10*3/uL (ref 1.7–7.7)
Neutrophils Relative %: 65 %
Platelets: 245 10*3/uL (ref 150–400)
RBC: 5.01 MIL/uL (ref 4.22–5.81)
RDW: 11.9 % (ref 11.5–15.5)
WBC: 6.9 10*3/uL (ref 4.0–10.5)
nRBC: 0 % (ref 0.0–0.2)

## 2021-01-15 LAB — D-DIMER, QUANTITATIVE: D-Dimer, Quant: <0.27 ug/mL-FEU (ref 0.00–0.50)

## 2021-01-15 NOTE — ED Notes (Signed)
Discharge paperwork given to pt. Pt agreeable to discharge and understands instructions. Esignature pad not working.

## 2021-01-15 NOTE — Discharge Instructions (Addendum)
Work-up today without any explanation for the shortness of breath.  However sodium is a little bit on the low side.  But nothing critical.  Keep your appointment to follow-up with your new primary care doctor as scheduled for Tuesday.  Would recommend some additional lab testing like thyroid testing.  And follow-up of your sodium.  Return for any new or worse symptoms.

## 2021-01-15 NOTE — ED Triage Notes (Signed)
Pt presents to the ED with cold sweats and SOB and he feels "off".

## 2021-01-15 NOTE — ED Triage Notes (Addendum)
Emergency Medicine Provider Triage Evaluation Note  Zachary Patrick , a 35 y.o. male  was evaluated in triage.  Pt complains of sweating on legs and back. Happens throughout the day x 1 week. Additionally has DOE, "when I drink something really fast".  Review of Systems  Positive: Sweating, DOE Negative: Fevers, chills, syncope, N/V/D  Physical Exam  BP (!) 129/118 (BP Location: Left Arm)   Pulse 72   Temp 98.4 F (36.9 C) (Oral)   Resp 16   SpO2 96%  Gen:   Awake, no distress   HEENT:  Atraumatic  Resp:  Normal effort  Cardiac:  Normal rate  Abd:   Nondistended, nontender  MSK:   Moves extremities without difficulty  Neuro:  Speech clear   Medical Decision Making  Medically screening exam initiated at 10:41 AM.  Appropriate orders placed.  Zachary Patrick was informed that the remainder of the evaluation will be completed by another provider, this initial triage assessment does not replace that evaluation, and the importance of remaining in the ED until their evaluation is complete.  Clinical Impression  Basic labs ordered, chest xray ordered. He is clinically and hemodynamically stable, safe to wait in the waiting room until room becomes available.   Emeline Darling, PA-C 01/15/21 1044    Zachary Humm R, PA-C 01/15/21 1049

## 2021-01-15 NOTE — ED Provider Notes (Signed)
[facility] EMERGENCY DEPARTMENT Provider Note   CSN: [id number] Arrival date & time: 01/15/21  1024     History Chief Complaint  Patient presents with  . Night Sweats    Zachary Patrick is a 35 y.o. male.  Patient presenting with a 1 week history of some dyspnea on exertion mostly when he first stands up.  Also has been having some sweating on the legs and back happens intermittently throughout the day.  Denies fever or chills.  Abdominal pain headache hurting to pee nausea vomiting or diarrhea or any unusual rash.  Patient does have follow-up with a new primary care doctor on Tuesday.  Patient's vital signs in triage afebrile heart rate 72 respirations 16 oxygen saturation on room air 96%.  Blood pressure 129/18.  Home medications include Keppra Trileptal past medical history significant for seizures.  Patient was also on prednisone for a brief course on March 14.  And patient is also on Vimpat.  Patient denies any seizure activity.        Past Medical History:  Diagnosis Date  . Seizures ([place])     There are no problems to display for this patient.   No past surgical history on file.     No family history on file.  Social History   Tobacco Use  . Smoking status: Never Smoker  . Smokeless tobacco: Never Used  Substance Use Topics  . Alcohol use: Yes  . Drug use: Yes    Types: Marijuana    Home Medications Prior to Admission medications   Medication Sig Start Date End Date Taking? Authorizing Provider  lacosamide (VIMPAT) 200 MG TABS Take 200 mg by mouth 2 (two) times daily.    [provider]  levETIRAcetam (KEPPRA) 1000 MG tablet Take 2,000 mg by mouth 2 (two) times daily.     [provider]  oxcarbazepine (TRILEPTAL) 600 MG tablet Take 1,200 mg by mouth 2 (two) times daily.     [provider]  predniSONE (DELTASONE) 20 MG tablet Take 2 tablets (40 mg total) by mouth daily. Take 40 mg by mouth daily for 3 days,  then 20mg  by mouth daily for 3 days, then 10mg  daily for 3 days 12/08/20   Montine Circle, PA-C    Allergies    Patient has no known allergies.  Review of Systems   Review of Systems  Constitutional: Positive for diaphoresis. Negative for chills and fever.  HENT: Negative for congestion, rhinorrhea and sore throat.   Eyes: Negative for visual disturbance.  Respiratory: Positive for shortness of breath. Negative for cough.   Cardiovascular: Negative for chest pain and leg swelling.  Gastrointestinal: Negative for abdominal pain, diarrhea, nausea and vomiting.  Genitourinary: Negative for dysuria.  Musculoskeletal: Negative for back pain and neck pain.  Skin: Negative for rash.  Neurological: Negative for dizziness, light-headedness and headaches.  Hematological: Does not bruise/bleed easily.  Psychiatric/Behavioral: Negative for confusion.    Physical Exam Updated Vital Signs BP 113/76 (BP Location: Right Arm)   Pulse (!) 55   Temp 98.5 F (36.9 C) (Oral)   Resp 15   SpO2 94%   Physical Exam Vitals and nursing note reviewed.  Constitutional:      General: He is not in acute distress.    Appearance: Normal appearance. He is well-developed. He is not diaphoretic.  HENT:     Head: Normocephalic and atraumatic.  Eyes:     Extraocular Movements: Extraocular movements intact.  Conjunctiva/sclera: Conjunctivae normal.     Pupils: Pupils are equal, round, and reactive to light.  Cardiovascular:     Rate and Rhythm: Normal rate and regular rhythm.     Heart sounds: No murmur heard.   Pulmonary:     Effort: Pulmonary effort is normal. No respiratory distress.     Breath sounds: Normal breath sounds. No wheezing or rales.  Abdominal:     Palpations: Abdomen is soft.     Tenderness: There is no abdominal tenderness.  Musculoskeletal:        General: No swelling. Normal range of motion.     Cervical back: Normal range of motion and neck supple. No rigidity.  Skin:     General: Skin is warm and dry.     Capillary Refill: Capillary refill takes less than 2 seconds.  Neurological:     General: No focal deficit present.     Mental Status: He is alert and oriented to person, place, and time.     Cranial Nerves: No cranial nerve deficit.     Sensory: No sensory deficit.     Motor: No weakness.     ED Results / Procedures / Treatments   Labs (all labs ordered are listed, but only abnormal results are displayed) Labs Reviewed  BASIC METABOLIC PANEL - Abnormal; Notable for the following components:      Result Value   Sodium 133 (*)    Glucose, Bld 104 (*)    All other components within normal limits  CBC WITH DIFFERENTIAL/PLATELET  D-DIMER, QUANTITATIVE    EKG None  Radiology DG Chest 2 View  Result Date: 01/15/2021 CLINICAL DATA:  Chest pain. EXAM: CHEST - 2 VIEW COMPARISON:  December 28, 2020. FINDINGS: The heart size and mediastinal contours are within normal limits. Both lungs are clear. No pneumothorax or pleural effusion is noted. The visualized skeletal structures are unremarkable. IMPRESSION: No active cardiopulmonary disease. Electronically Signed   By: Marijo Conception M.D.   On: 01/15/2021 11:13    Procedures Procedures   Medications Ordered in ED Medications - No data to display  ED Course  I have reviewed the triage vital signs and the nursing notes.  Pertinent labs & imaging results that were available during my care of the patient were reviewed by me and considered in my medical decision making (see chart for details).    MDM Rules/Calculators/A&P                          Symptoms somewhat varied.  Patient nontoxic no acute distress.  Repeat blood pressure was 113/76.  Chest x-ray negative.  CBC without leukocytosis or anemia.  D-dimer was normal.  Electrolytes significant for sodium of 133 but chloride is normal.  Renal function normal.  Feel the patient needs further work-up with primary care doctor for the symptoms to include  thyroid function test.  Of the patient's antiseizure medicines, Keppra can have a level.  But chose not to send that off because it takes so long to come back.  Do not think patient is necessarily toxic.  But it these medications are always possible for side effects.  Patient nontoxic no acute distress.  Patient will be discharged for further evaluation by primary care provider.  The lab work we did here today was very reassuring normal chest x-ray and negative D-dimer.  No leukocytosis.  No anemia.  Just some mild hyponatremia.    Final Clinical Impression(s) /  ED Diagnoses Final diagnoses:  Night sweats  Hyponatremia    Rx / DC Orders ED Discharge Orders    None       Fredia Sorrow, MD 01/15/21 1627

## 2021-02-19 ENCOUNTER — Encounter: Payer: Self-pay | Admitting: Gastroenterology

## 2021-03-31 ENCOUNTER — Encounter: Payer: Self-pay | Admitting: Gastroenterology

## 2021-03-31 ENCOUNTER — Ambulatory Visit (INDEPENDENT_AMBULATORY_CARE_PROVIDER_SITE_OTHER): Payer: Medicare Other | Admitting: Gastroenterology

## 2021-03-31 VITALS — BP 118/78 | HR 70 | Ht 72.0 in | Wt 271.0 lb

## 2021-03-31 DIAGNOSIS — K921 Melena: Secondary | ICD-10-CM

## 2021-03-31 DIAGNOSIS — K5900 Constipation, unspecified: Secondary | ICD-10-CM

## 2021-03-31 DIAGNOSIS — R12 Heartburn: Secondary | ICD-10-CM | POA: Diagnosis not present

## 2021-03-31 MED ORDER — NA SULFATE-K SULFATE-MG SULF 17.5-3.13-1.6 GM/177ML PO SOLN: 1.0000 | Freq: Once | ORAL | 0 refills | Status: AC

## 2021-03-31 NOTE — Patient Instructions (Addendum)
If you are age 33 or older, your body mass index should be between 23-30. Your Body mass index is 36.75 kg/m. If this is out of the aforementioned range listed, please consider follow up with your Primary Care Provider.  If you are age 33 or younger, your body mass index should be between 19-25. Your Body mass index is 36.75 kg/m. If this is out of the aformentioned range listed, please consider follow up with your Primary Care Provider.   You have been scheduled for a colonoscopy. Please follow written instructions given to you at your visit today.  Please pick up your prep supplies at the pharmacy within the next 1-3 days. If you use inhalers (even only as needed), please bring them with you on the day of your procedure.  Start Metamucil once daily. You may increase to 2-3 times daily if there is no change in bowel habits.  The [facility] providers would like to encourage you to use [facility] to communicate with providers for non-urgent requests or questions.  Due to long hold times on the telephone, sending your provider a message by [facility] may be a faster and more efficient way to get a response.  Please allow 48 business hours for a response.  Please remember that this is for non-urgent requests.   It was a pleasure to see you today!  Thank you for trusting me with your gastrointestinal care!    Scott E. Candis Schatz, MD

## 2021-03-31 NOTE — Progress Notes (Signed)
HPI : 35 year old male is referred to gastroenterology for bright red blood per rectum.  The patient has been having intermittent bright red blood per rectum for several years now.  However, more recently the blood has become more profuse and has been darker in color, sometimes maroon-colored.  He sometimes experiences pain with the passage of stool and pain with wiping, as well as perianal burning and itching.  He also reports feeling protuberances from the anal canal which reduces spontaneously.  He denies any significant abdominal pain, other than an occasional left lower quadrant crampy pain that improves with passage of stool.  He denies diarrhea.  He is bothered by constipation, usually having only 1 bowel movement per week.  His stool is often hard initially, and difficult to pass.  He does report sitting on the toilet for prolonged periods with significant straining.  He has not tried taking any medications to help with his constipation.  The last time he saw blood was about a week ago.  He denies significant symptoms of fatigue or exertional dyspnea or lightheadedness.  No weight loss, rather he has been gaining weight.  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  His last seizure was about 4 weeks ago, and lasted only a few seconds.  He also reports a history of infrequent heartburn, usually once a month or less which is not severe and which resolves with over-the-counter antacids.  He denies dysphagia.      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     levETIRAcetam (KEPPRA) 1000 MG tablet Take 2,000 mg by mouth 2 (two) times daily.      oxcarbazepine (TRILEPTAL) 600 MG tablet Take 1,200 mg by mouth 2 (two) times daily.      No current facility-administered medications for this visit.   No Known Allergies   Review of Systems: Patient otherwise denies nausea, vomiting, diarrhea, melena, night sweats, fever, chills, weight loss, early satiety, dysphagia, odynophagia. No fevers, cough, night sweats, headaches, shortness of breath, confusion, depression, skin rash, blood in urine, pain with urination  Physical Exam: BP 118/78   Pulse 70   Ht 6' (1.829 m)   Wt 271 lb (122.9 kg)   SpO2 97%   BMI 36.75 kg/m  Constitutional: Pleasant,well-developed, obese Caucasian male in no acute distress. HEENT: Normocephalic and atraumatic. Conjunctivae are normal. No scleral icterus. Cardiovascular: Normal rate, regular rhythm.  Pulmonary/chest: Effort normal and breath sounds normal. No wheezing, rales or rhonchi. Abdominal: Soft,  nondistended, nontender. Bowel sounds active throughout. There are no masses palpable. No hepatomegaly. Extremities: no edema Neurological: Alert and oriented to person place and time. Skin: Skin is warm and dry. No rashes noted. Psychiatric: Normal mood and affect. Behavior is normal.   ASSESSMENT AND PLAN:  35 year old male with chronic bright red blood per rectum which is recently worsened, characterized by more profuse, darker appearing blood.  He also has symptoms and risk factors for both hemorrhoids and anal fissures.  It is very  likely that the bleeding is from a benign anorectal outlet, however given the worsening of his symptoms and the potential for other possibilities (mass, proctitis) a colonoscopy is reasonable.  We discussed performing colonoscopy versus doing less aggressive interventions, such as office digital rectal exam and anoscopy exam.  I recommended we perform a colonoscopy to more definitively exclude all other etiologies. His epilepsy has been well controlled since placement of the RNS device three years ago, and should not present a major risk for sedation.   The risks (including bleeding, perforation, infection, missed lesions, medication reactions and possible hospitalization or surgery if complications occur), benefits, and alternatives to colonoscopy with possible biopsy and possible polypectomy were discussed with the patient and they consent to proceed.   In the meantime, I recommended the patient start taking a daily fiber supplement to improve his stool bulk and consistency and reduce his suspected anorectal outlet bleeding.  I advised him to improve his toileting habits and avoid sitting on the toilet for prolonged periods.  Specifically I recommended if he is unable to pass stool within the first 2 to 3 minutes, he should get up and try again later.  For his GERD, he has infrequent, minor symptoms which respond to OTC antacids.  I recommended that he continue this treatment as needed.  I suggested that weight loss would probably help with his GERD as well.

## 2021-04-10 ENCOUNTER — Other Ambulatory Visit: Payer: Self-pay

## 2021-04-10 ENCOUNTER — Ambulatory Visit (AMBULATORY_SURGERY_CENTER): Payer: Medicare Other | Admitting: Gastroenterology

## 2021-04-10 ENCOUNTER — Encounter: Payer: Self-pay | Admitting: Gastroenterology

## 2021-04-10 VITALS — BP 107/67 | HR 59 | Temp 97.6°F | Resp 16 | Ht 72.0 in | Wt 271.0 lb

## 2021-04-10 DIAGNOSIS — K639 Disease of intestine, unspecified: Secondary | ICD-10-CM | POA: Diagnosis not present

## 2021-04-10 DIAGNOSIS — D122 Benign neoplasm of ascending colon: Secondary | ICD-10-CM

## 2021-04-10 DIAGNOSIS — K921 Melena: Secondary | ICD-10-CM

## 2021-04-10 DIAGNOSIS — K5900 Constipation, unspecified: Secondary | ICD-10-CM

## 2021-04-10 MED ORDER — SODIUM CHLORIDE 0.9 % IV SOLN: 500.0000 mL | Freq: Once | INTRAVENOUS | Status: DC

## 2021-04-10 NOTE — Progress Notes (Signed)
A and O x3. Report to RN. Tolerated MAC anesthesia well. 

## 2021-04-10 NOTE — Patient Instructions (Addendum)
Handout on polyps & hemorrhoids,& high fiber diet   given to you today  Await pathology results on polyp removed and on sigmoid biopsies done   Information on hemorrhoid banding given to you - call office if interested in having this procedure done    YOU HAD AN ENDOSCOPIC PROCEDURE TODAY AT [facility]:   Refer to the procedure report that was given to you for any specific questions about what was found during the examination.  If the procedure report does not answer your questions, please call your gastroenterologist to clarify.  If you requested that your care partner not be given the details of your procedure findings, then the procedure report has been included in a sealed envelope for you to review at your convenience later.  YOU SHOULD EXPECT: Some feelings of bloating in the abdomen. Passage of more gas than usual.  Walking can help get rid of the air that was put into your GI tract during the procedure and reduce the bloating. If you had a lower endoscopy (such as a colonoscopy or flexible sigmoidoscopy) you may notice spotting of blood in your stool or on the toilet paper. If you underwent a bowel prep for your procedure, you may not have a normal bowel movement for a few days.  Please Note:  You might notice some irritation and congestion in your nose or some drainage.  This is from the oxygen used during your procedure.  There is no need for concern and it should clear up in a day or so.  SYMPTOMS TO REPORT IMMEDIATELY:  Following lower endoscopy (colonoscopy or flexible sigmoidoscopy):  Excessive amounts of blood in the stool  Significant tenderness or worsening of abdominal pains  Swelling of the abdomen that is new, acute  Fever of 100F or higher   For urgent or emergent issues, a gastroenterologist can be reached at any hour by calling [phone]. Do not use MyChart messaging for urgent concerns.    DIET:  We do recommend a small meal at first, but  then you may proceed to your regular diet.  Drink plenty of fluids but you should avoid alcoholic beverages for 24 hours.  ACTIVITY:  You should plan to take it easy for the rest of today and you should NOT DRIVE or use heavy machinery until tomorrow (because of the sedation medicines used during the test).    FOLLOW UP: Our staff will call the number listed on your records 48-72 hours following your procedure to check on you and address any questions or concerns that you may have regarding the information given to you following your procedure. If we do not reach you, we will leave a message.  We will attempt to reach you two times.  During this call, we will ask if you have developed any symptoms of COVID 19. If you develop any symptoms (ie: fever, flu-like symptoms, shortness of breath, cough etc.) before then, please call [phone].  If you test positive for Covid 19 in the 2 weeks post procedure, please call and report this information to [place].    If any biopsies were taken you will be contacted by phone or by letter within the next 1-3 weeks.  Please call us at [phone] if you have not heard about the biopsies in 3 weeks.    SIGNATURES/CONFIDENTIALITY: You and/or your care partner have signed paperwork which will be entered into your electronic medical record.  These signatures attest to the fact that that  the information above on your After Visit Summary has been reviewed and is understood.  Full responsibility of the confidentiality of this discharge information lies with you and/or your care-partner.

## 2021-04-10 NOTE — Op Note (Signed)
[facility] Patient Name: Zachary Patrick Procedure Date: 04/10/2021 2:23 PM MRN: [id number] Endoscopist: Nicki Reaper E. Candis Schatz , MD Age: 35 Referring MD:  Date of Birth: 12-12-1985 Gender: Male Account #: [id number] Procedure:                Colonoscopy Indications:              Hematochezia Medicines:                Monitored Anesthesia Care Procedure:                Pre-Anesthesia Assessment:                           - Prior to the procedure, a History and Physical                            was performed, and patient medications and                            allergies were reviewed. The patient's tolerance of                            previous anesthesia was also reviewed. The risks                            and benefits of the procedure and the sedation                            options and risks were discussed with the patient.                            All questions were answered, and informed consent                            was obtained. Prior Anticoagulants: The patient has                            taken no previous anticoagulant or antiplatelet                            agents. ASA Grade Assessment: II - A patient with                            mild systemic disease. After reviewing the risks                            and benefits, the patient was deemed in                            satisfactory condition to undergo the procedure.                           After obtaining informed consent, the colonoscope  was passed under direct vision. Throughout the                            procedure, the patient's blood pressure, pulse, and                            oxygen saturations were monitored continuously. The                            Colonoscope was introduced through the anus and                            advanced to the the terminal ileum, with                            identification of the appendiceal orifice and IC                             valve. The colonoscopy was performed without                            difficulty. The patient tolerated the procedure                            well. The quality of the bowel preparation was                            good. The terminal ileum, ileocecal valve,                            appendiceal orifice, and rectum were photographed. Scope In: 2:37:28 PM Scope Out: 2:54:22 PM Scope Withdrawal Time: 0 hours 14 minutes 39 seconds  Total Procedure Duration: 0 hours 16 minutes 54 seconds  Findings:                 Large external hemorrhoids were found on perianal                            exam.                           A localized area of mildly erythematous mucosa was                            found in the sigmoid colon, characterized by                            discrete ovoid areas of erythema separated by                            normal mucosa. Biopsies were taken with a cold                            forceps for histology. Estimated blood loss was  minimal.                           A 2 mm polyp was found in the ascending colon. The                            polyp was sessile. The polyp was removed with a                            jumbo cold forceps. Resection and retrieval were                            complete. Estimated blood loss was minimal.                           The exam was otherwise normal throughout the                            examined colon.                           The terminal ileum appeared normal.                           Non-bleeding internal hemorrhoids were found during                            retroflexion.                           No additional abnormalities were found on                            retroflexion. Complications:            No immediate complications. Estimated Blood Loss:     Estimated blood loss was minimal. Impression:               - Hemorrhoids found on perianal exam.                            - Erythematous mucosa in the sigmoid colon.                            Biopsied. Likely related to bowel prep.                           - One 2 mm polyp in the ascending colon, removed                            with a jumbo cold forceps. Resected and retrieved.                           - The examined portion of the ileum was normal.                           -  Non-bleeding internal hemorrhoids.                           - The patient's hematochezia can be attributed to                            hemorrhoids. Recommendation:           - Patient has a contact number available for                            emergencies. The signs and symptoms of potential                            delayed complications were discussed with the                            patient. Return to normal activities tomorrow.                            Written discharge instructions were provided to the                            patient.                           - Resume previous diet.                           - Continue present medications.                           - Await pathology results.                           - Repeat colonoscopy in 10 years for surveillance.                           - Recommend high fiber diet or daily fiber                            supplement such as Metamucil. Liya Strollo E. Candis Schatz, MD 04/10/2021 3:04:41 PM This report has been signed electronically.

## 2021-04-10 NOTE — Progress Notes (Signed)
VS taken by C.W. 

## 2021-04-14 ENCOUNTER — Telehealth: Payer: Self-pay | Admitting: *Deleted

## 2021-04-14 NOTE — Telephone Encounter (Signed)
  Follow up Call-  Call back number 04/10/2021  Post procedure Call Back phone  # [phone]  Permission to leave phone message Yes  Some recent data might be hidden     Patient questions:  Do you have a fever, pain , or abdominal swelling? No. Pain Score  0 *  Have you tolerated food without any problems? Yes.    Have you been able to return to your normal activities? Yes.    Do you have any questions about your discharge instructions: Diet   No. Medications  No. Follow up visit  No.  Do you have questions or concerns about your Care? No.  Actions: * If pain score is 4 or above: No action needed, pain <4.  Have you developed a fever since your procedure? no  2.   Have you had an respiratory symptoms (SOB or cough) since your procedure? no  3.   Have you tested positive for COVID 19 since your procedure no  4.   Have you had any family members/close contacts diagnosed with the COVID 19 since your procedure?  no   If yes to any of these questions please route to Joylene John, RN and Joella Prince, RN

## 2021-04-20 ENCOUNTER — Encounter: Payer: Self-pay | Admitting: Gastroenterology

## 2021-07-20 ENCOUNTER — Emergency Department (HOSPITAL_COMMUNITY): Payer: Medicare Other

## 2021-07-20 ENCOUNTER — Encounter (HOSPITAL_COMMUNITY): Payer: Self-pay | Admitting: Emergency Medicine

## 2021-07-20 ENCOUNTER — Emergency Department (HOSPITAL_COMMUNITY)
Admission: EM | Admit: 2021-07-20 | Discharge: 2021-07-20 | Disposition: A | Discharge status: Home / Self Care | Payer: Medicare Other | Attending: Emergency Medicine | Admitting: Emergency Medicine

## 2021-07-20 DIAGNOSIS — G40909 Epilepsy, unspecified, not intractable, without status epilepticus: Secondary | ICD-10-CM | POA: Diagnosis not present

## 2021-07-20 DIAGNOSIS — R569 Unspecified convulsions: Secondary | ICD-10-CM | POA: Diagnosis present

## 2021-07-20 DIAGNOSIS — Z5321 Procedure and treatment not carried out due to patient leaving prior to being seen by health care provider: Secondary | ICD-10-CM | POA: Diagnosis not present

## 2021-07-20 LAB — BASIC METABOLIC PANEL
Anion gap: 5 (ref 5–15)
BUN: 11 mg/dL (ref 6–20)
CO2: 27 mmol/L (ref 22–32)
Calcium: 8.7 mg/dL — ABNORMAL LOW (ref 8.9–10.3)
Chloride: 100 mmol/L (ref 98–111)
Creatinine, Ser: 0.87 mg/dL (ref 0.61–1.24)
GFR, Estimated: >60 mL/min (ref >60)
Glucose, Bld: 116 mg/dL — ABNORMAL HIGH (ref 70–99)
Potassium: 4.3 mmol/L (ref 3.5–5.1)
Sodium: 132 mmol/L — ABNORMAL LOW (ref 135–145)

## 2021-07-20 LAB — CBC WITH DIFFERENTIAL/PLATELET
Abs Immature Granulocytes: 0.08 10*3/uL — ABNORMAL HIGH (ref 0.00–0.07)
Basophils Absolute: 0.1 10*3/uL (ref 0.0–0.1)
Basophils Relative: 0 %
Eosinophils Absolute: 0 10*3/uL (ref 0.0–0.5)
Eosinophils Relative: 0 %
HCT: 47.3 % (ref 39.0–52.0)
Hemoglobin: 16.5 g/dL (ref 13.0–17.0)
Immature Granulocytes: 1 %
Lymphocytes Relative: 5 %
Lymphs Abs: 0.7 10*3/uL (ref 0.7–4.0)
MCH: 31.4 pg (ref 26.0–34.0)
MCHC: 34.9 g/dL (ref 30.0–36.0)
MCV: 90.1 fL (ref 80.0–100.0)
Monocytes Absolute: 0.7 10*3/uL (ref 0.1–1.0)
Monocytes Relative: 5 %
Neutro Abs: 13.6 10*3/uL — ABNORMAL HIGH (ref 1.7–7.7)
Neutrophils Relative %: 89 %
Platelets: 198 10*3/uL (ref 150–400)
RBC: 5.25 MIL/uL (ref 4.22–5.81)
RDW: 11.9 % (ref 11.5–15.5)
WBC: 15 10*3/uL — ABNORMAL HIGH (ref 4.0–10.5)
nRBC: 0 % (ref 0.0–0.2)

## 2021-07-20 NOTE — ED Notes (Signed)
Pt mother stormed out of room stating take his IV out we have been here waiting for 12 hours. Pt has been here 8 hours. Pt okay with IV being taken out and leaving. Mother came back and walked him out.

## 2021-07-20 NOTE — ED Provider Notes (Signed)
Emergency Medicine Provider Triage Evaluation Note  Zachary Patrick , a 35 y.o. male  was evaluated in triage.  Pt complains of seizure-like activity.  Mother at bedside and states patient had a seizure that lasted roughly 40 to 45 minutes which is atypical for patient.  Last seizure was roughly 1 month ago.  Patient is on numerous antiepileptic medications which he has been compliant with. Patient may have taken an extra pill last night per mother.  Patient follows neurology at [facility].  Mother at bedside states that patient became weak and began drooling which is typical of his most recent seizures.  No convulsions.  Mother notes she heard a loud thump and found patient on the floor.  Patient has been drowsy following the incident. Mom notes that patient has been sneezing and coughing more recently. Mother is sick with similar symptoms.   Review of Systems  Positive: seizure Negative: fever  Physical Exam  BP 140/89 (BP Location: Left Arm)   Pulse 84   Temp 98.2 F (36.8 C) (Oral)   Resp 18   SpO2 94%  Gen:   Awake, no distress   Resp:  Normal effort  MSK:   Moves extremities without difficulty  Other:    Medical Decision Making  Medically screening exam initiated at 12:52 PM.  Appropriate orders placed.  MUSTAF ANTONACCI was informed that the remainder of the evaluation will be completed by another provider, this initial triage assessment does not replace that evaluation, and the importance of remaining in the ED until their evaluation is complete.  Routine labs CT head/cervical spine due to trauma COVID test due to URI symptoms.    Suzy Bouchard, PA-C 07/20/21 1255    Lorelle Gibbs, DO 07/20/21 1510

## 2021-07-20 NOTE — ED Triage Notes (Signed)
Per EMS, patient from home, witnessed seizure today. Takes keppra as prescribed. Took keppra dose PTA.  Postictal on EMS arrival. A&Ox4 now. Hx developmental delay and epilepsy. Has a seizure once every couple of months at baseline, per mother.   20g R hand

## 2023-07-12 IMAGING — CT CT CERVICAL SPINE W/O CM
3 of 4 series · 13 of 33 positions shown, 15 images · non-contrast
Comparison: No pertinent prior exams available for comparison.

CLINICAL DATA: Neck trauma, midline tenderness. Head trauma,
moderate/severe. Additional history provided: Witness seizure today.
History of epilepsy.

EXAM:
CT HEAD WITHOUT CONTRAST
CT CERVICAL SPINE WITHOUT CONTRAST
TECHNIQUE: Multidetector CT imaging of the head and cervical spine was
performed following the standard protocol without intravenous
contrast. Multiplanar CT image reconstructions of the cervical spine
were also generated.

[Series 4: c spine soft · axial · 0.29mm/px · z∈[+1192,+1294]mm · 4 of 87 slices shown]
[im 18/87  soft-tissue]
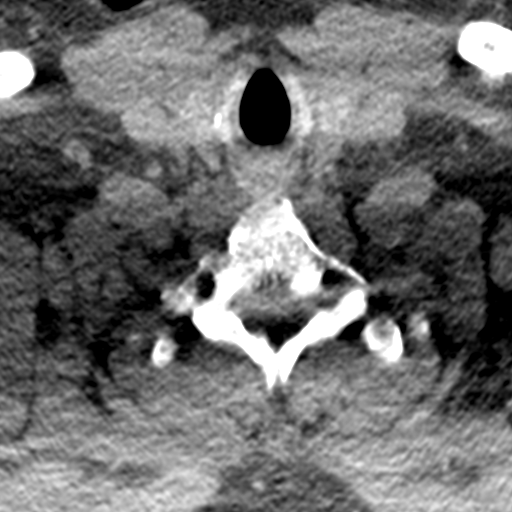
[im 35/87  soft-tissue]
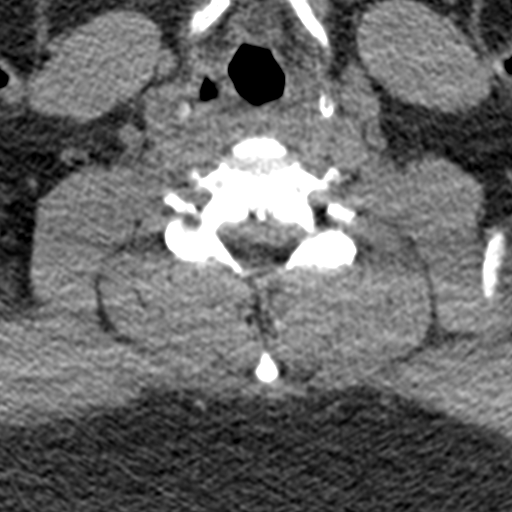
[im 52/87  soft-tissue]
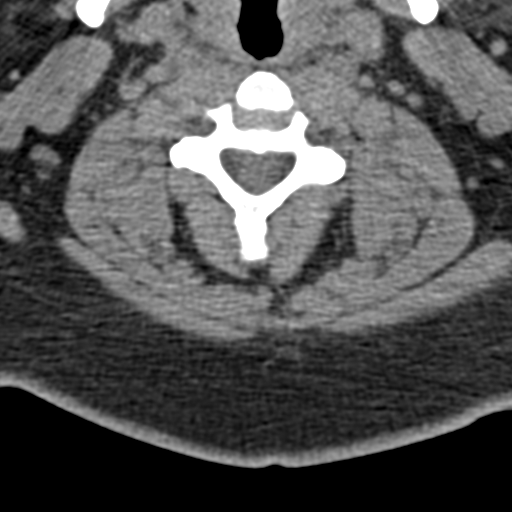
[im 69/87  soft-tissue]
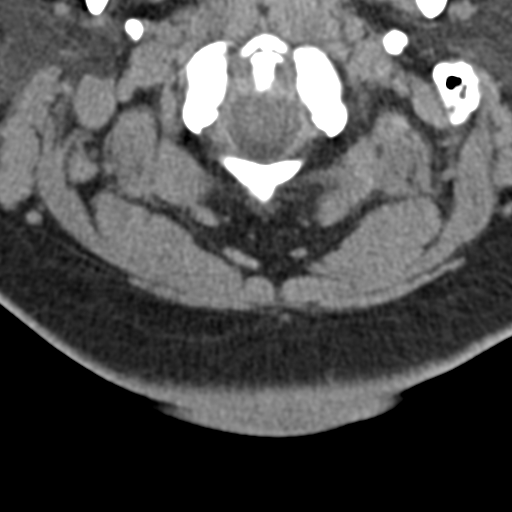

[Series 6: orthogonal bone · axial · 0.17mm/px · z∈[+1159,+1292]mm · 6 of 103 slices shown, 8 images]
[im 15/103  soft-tissue]
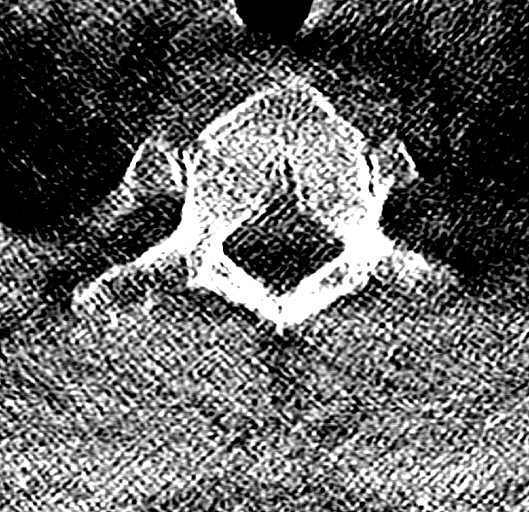
[im 15/103  bone]
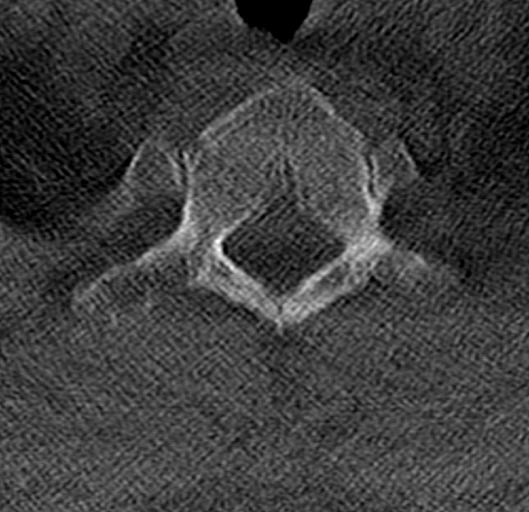
[im 30/103  bone]
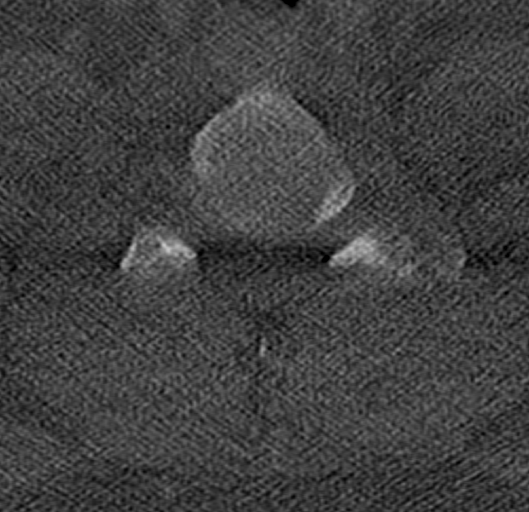
[im 44/103  bone]
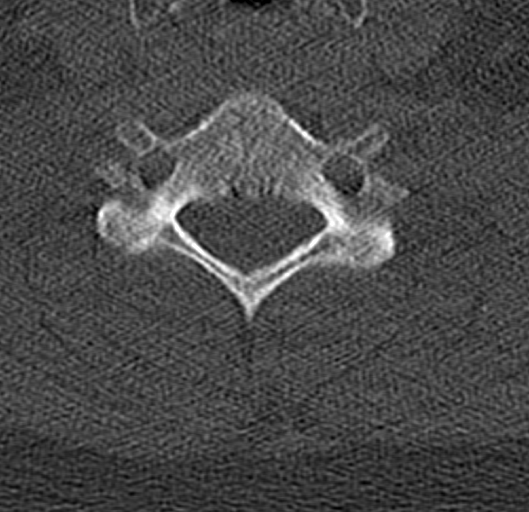
[im 59/103  bone]
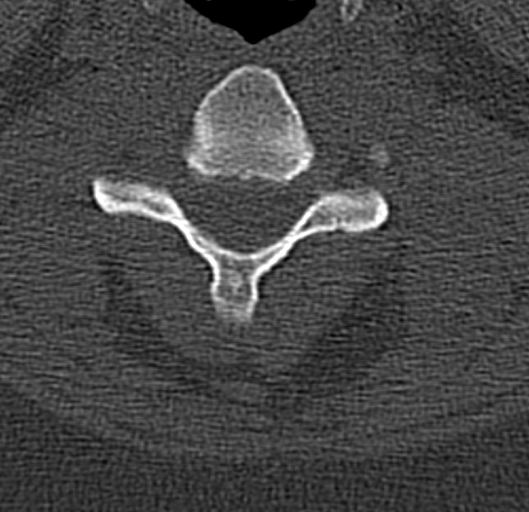
[im 73/103  soft-tissue]
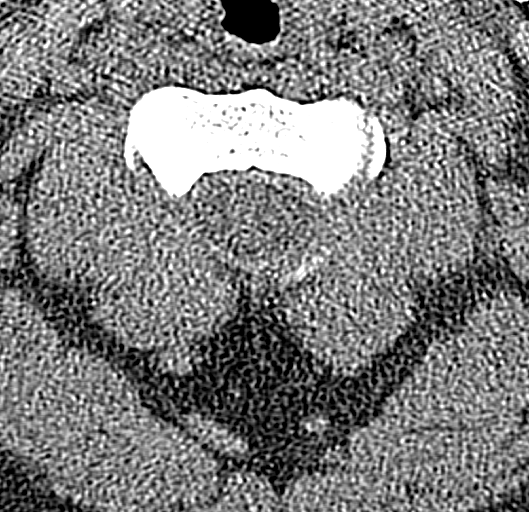
[im 73/103  bone]
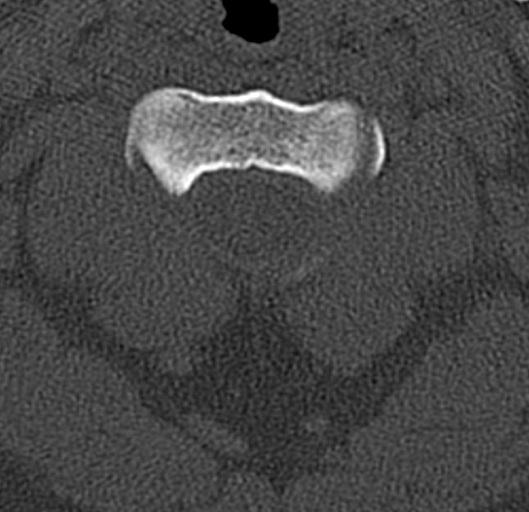
[im 88/103  bone]
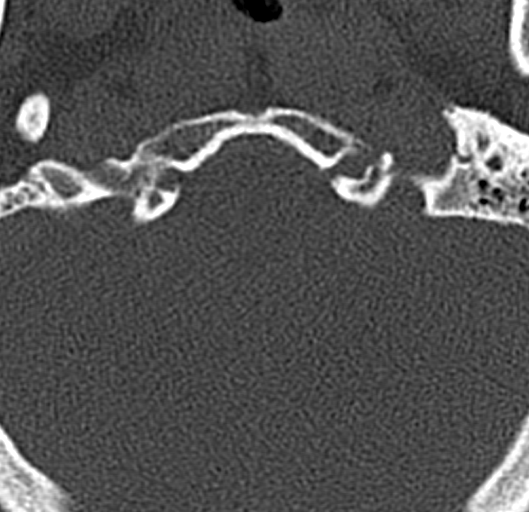

[Series 7: coronal bone · coronal · 0.18mm/px · 3 of 45 slices shown]
[im 9/45  bone]
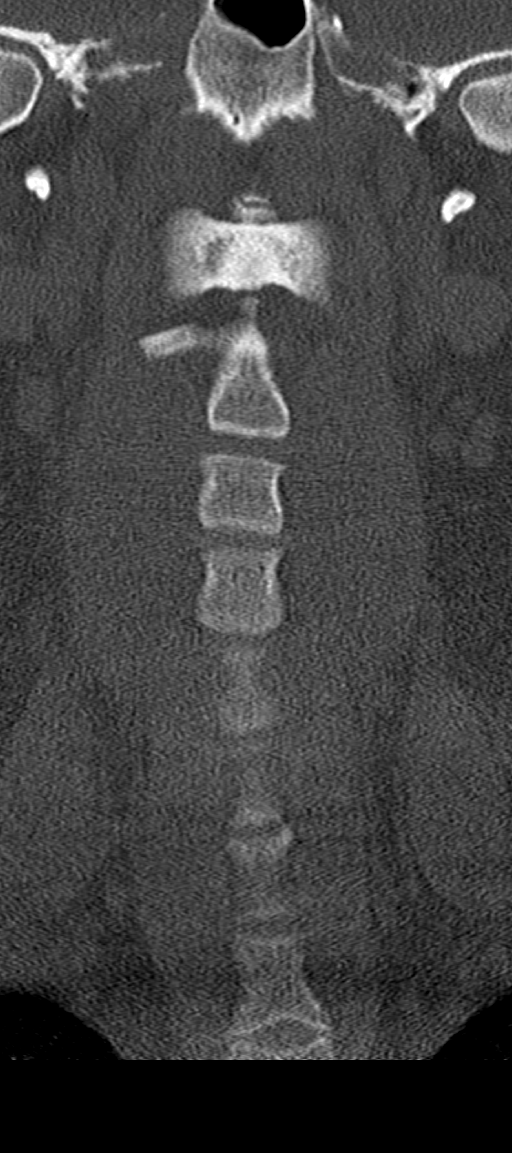
[im 18/45  bone]
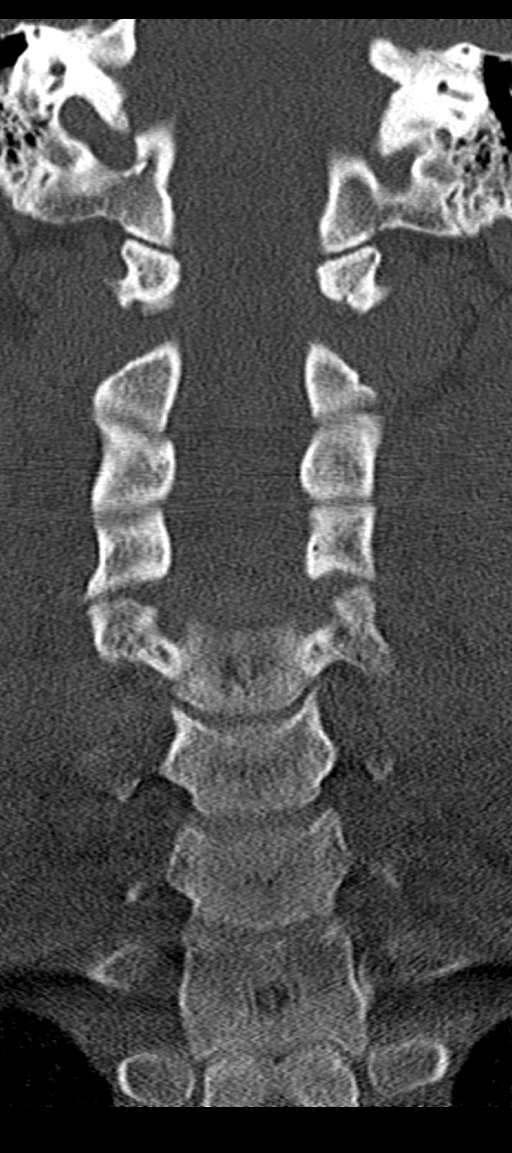
[im 27/45  bone]
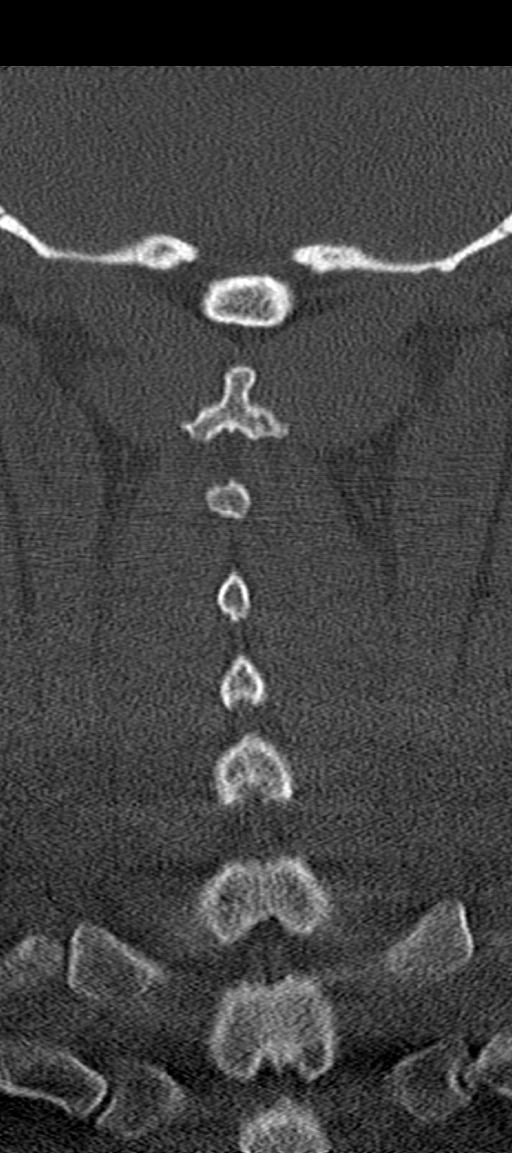

[13 of 33 positions shown; findings below may reference images not displayed]

FINDINGS: CT HEAD FINDINGS

Brain:

Streak and beam hardening artifact arising from multiple right-sided
deep brain stimulator leads and an associated generator pack,
limiting evaluation.

Within described limitations, there is no appreciable acute
intracranial hemorrhage, acute demarcated cortical infarction,
intracranial mass or extra-axial fluid collection. No midline shift.

Nonspecific punctate focus of calcification along left
frontoparietal lobes.

Vascular: No hyperdense vessel.

Skull: No calvarial fracture is identified. Multiple right-sided
burr holes.

Sinuses/Orbits: Visualized orbits show no acute finding. Partial
opacification of a right ethmoid air cell. Trace mucosal thickening
within the bilateral maxillary sinuses at the imaged levels.

CT CERVICAL SPINE FINDINGS

Alignment: Mild reversal of the expected cervical lordosis. Mild
cervical dextrocurvature. Trace C7-T1 grade 1 retrolisthesis.

Skull base and vertebrae: The basion-dental and atlanto-dental
intervals are maintained.No evidence of acute fracture to the
cervical spine.

Soft tissues and spinal canal: No prevertebral fluid or swelling. No
visible canal hematoma.

Disc levels: No significant bony spinal canal or neural foraminal
narrowing at any level.

Upper chest: No consolidation within the imaged lung apices. No
visible pneumothorax.
IMPRESSION: CT head:

1. Streak and beam hardening artifact arising from multiple
right-sided deep brain stimulator leads, and an associated generator
pack, limit evaluation.
2. Within this limitation, there is no evidence of acute
intracranial abnormality.
3. Mild paranasal sinus disease, as described.

CT cervical spine:

1. No evidence of acute fracture to the cervical spine.
2. Mild nonspecific reversal of the expected cervical lordosis.
3. Mild cervical dextrocurvature.
4. Trace C7-T1 grade 1 retrolisthesis.
